# Patient Record
Sex: Male | Born: 1965 | Race: White | Hispanic: No | Smoking: Never smoker
Health system: Southern US, Community
[De-identification: ages and names within clinical notes are randomized; demographics above are authoritative.]

## PROBLEM LIST (undated history)

## (undated) DIAGNOSIS — E119 Type 2 diabetes mellitus without complications: Secondary | ICD-10-CM

## (undated) DIAGNOSIS — I10 Essential (primary) hypertension: Secondary | ICD-10-CM

## (undated) DIAGNOSIS — F419 Anxiety disorder, unspecified: Secondary | ICD-10-CM

## (undated) HISTORY — PX: CHOLECYSTECTOMY: SHX55

---

## 2005-01-06 ENCOUNTER — Emergency Department: Payer: Self-pay | Admitting: Emergency Medicine

## 2009-01-11 ENCOUNTER — Emergency Department: Payer: Self-pay | Admitting: Emergency Medicine

## 2010-03-05 ENCOUNTER — Emergency Department: Payer: Self-pay | Admitting: Internal Medicine

## 2010-04-04 ENCOUNTER — Emergency Department: Payer: Self-pay | Admitting: Emergency Medicine

## 2010-10-24 ENCOUNTER — Emergency Department: Payer: Self-pay | Admitting: Emergency Medicine

## 2015-02-28 ENCOUNTER — Ambulatory Visit: Payer: Self-pay | Admitting: Family Medicine

## 2015-05-01 ENCOUNTER — Emergency Department: Payer: Self-pay

## 2015-05-01 ENCOUNTER — Emergency Department
Admission: EM | Admit: 2015-05-01 | Discharge: 2015-05-01 | Disposition: A | Discharge status: Home / Self Care | Payer: Self-pay | Attending: Emergency Medicine | Admitting: Emergency Medicine

## 2015-05-01 ENCOUNTER — Other Ambulatory Visit: Payer: Self-pay

## 2015-05-01 DIAGNOSIS — R739 Hyperglycemia, unspecified: Secondary | ICD-10-CM

## 2015-05-01 DIAGNOSIS — F419 Anxiety disorder, unspecified: Secondary | ICD-10-CM | POA: Insufficient documentation

## 2015-05-01 DIAGNOSIS — Z87891 Personal history of nicotine dependence: Secondary | ICD-10-CM | POA: Insufficient documentation

## 2015-05-01 DIAGNOSIS — R079 Chest pain, unspecified: Secondary | ICD-10-CM | POA: Insufficient documentation

## 2015-05-01 DIAGNOSIS — I10 Essential (primary) hypertension: Secondary | ICD-10-CM | POA: Insufficient documentation

## 2015-05-01 DIAGNOSIS — E1165 Type 2 diabetes mellitus with hyperglycemia: Secondary | ICD-10-CM | POA: Insufficient documentation

## 2015-05-01 HISTORY — DX: Anxiety disorder, unspecified: F41.9

## 2015-05-01 HISTORY — DX: Essential (primary) hypertension: I10

## 2015-05-01 HISTORY — DX: Type 2 diabetes mellitus without complications: E11.9

## 2015-05-01 LAB — BASIC METABOLIC PANEL
Anion gap: 11 (ref 5–15)
BUN: 16 mg/dL (ref 6–20)
CO2: 23 mmol/L (ref 22–32)
Calcium: 8.7 mg/dL — ABNORMAL LOW (ref 8.9–10.3)
Chloride: 99 mmol/L — ABNORMAL LOW (ref 101–111)
Creatinine, Ser: 0.73 mg/dL (ref 0.61–1.24)
GFR calc Af Amer: >60 mL/min (ref >60)
GFR calc non Af Amer: >60 mL/min (ref >60)
Glucose, Bld: 300 mg/dL — ABNORMAL HIGH (ref 65–99)
Potassium: 3.7 mmol/L (ref 3.5–5.1)
Sodium: 133 mmol/L — ABNORMAL LOW (ref 135–145)

## 2015-05-01 LAB — CBC
HCT: 41.2 % (ref 40.0–52.0)
Hemoglobin: 14.4 g/dL (ref 13.0–18.0)
MCH: 29.1 pg (ref 26.0–34.0)
MCHC: 35 g/dL (ref 32.0–36.0)
MCV: 83.1 fL (ref 80.0–100.0)
Platelets: 253 10*3/uL (ref 150–440)
RBC: 4.95 MIL/uL (ref 4.40–5.90)
RDW: 13.6 % (ref 11.5–14.5)
WBC: 11.8 10*3/uL — ABNORMAL HIGH (ref 3.8–10.6)

## 2015-05-01 LAB — TROPONIN I: Troponin I: <0.03 ng/mL (ref <0.031)

## 2015-05-01 LAB — BRAIN NATRIURETIC PEPTIDE: B Natriuretic Peptide: 39 pg/mL (ref 0.0–100.0)

## 2015-05-01 LAB — GLUCOSE, CAPILLARY: Glucose-Capillary: 272 mg/dL — ABNORMAL HIGH (ref 65–99)

## 2015-05-01 MED ORDER — DIAZEPAM 5 MG PO TABS: 5.0000 mg | ORAL_TABLET | Freq: Three times a day (TID) | ORAL | Status: AC | PRN

## 2015-05-01 MED ORDER — ESCITALOPRAM OXALATE 10 MG PO TABS: 10.0000 mg | ORAL_TABLET | Freq: Every day | ORAL | Status: DC

## 2015-05-01 MED ORDER — DIAZEPAM 5 MG PO TABS
10.0000 mg | ORAL_TABLET | Freq: Once | ORAL | Status: AC
  Administered 2015-05-01: 10 mg via ORAL
  Filled 2015-05-01: qty 2

## 2015-05-01 NOTE — ED Provider Notes (Signed)
[facility] Emergency Department Provider Note     Time seen: ----------------------------------------- 8:16 AM on 05/01/2015 -----------------------------------------    I have reviewed the triage vital signs and the nursing notes.   HISTORY  Chief Complaint Chest Pain    HPI Walter [name] is a 49 y.o. male who presents ER with chest tightness since 3 AM. Patient states he woke up very anxious, short of breath, both arms went numb. Patient describes nausea and diaphoresis which is common for him with panic attacks. Patient states she has history anxiety and is advised to take citalopram as needed. Patient states he called his doctor who advised him to double up on his medications. Patient also notes his blood sugars been elevated.   Past Medical History  Diagnosis Date  . Anxiety   . Diabetes mellitus without complication   . Hypertension     There are no active problems to display for this patient.   Past Surgical History  Procedure Laterality Date  . Cholecystectomy      Allergies Review of patient's allergies indicates no known allergies.  Social History History  Substance Use Topics  . Smoking status: Former Games developer  . Smokeless tobacco: Never Used  . Alcohol Use: No    Review of Systems Constitutional: Negative for fever. Eyes: Negative for visual changes. ENT: Negative for sore throat. Cardiovascular: Positive for chest tightness Respiratory: Positive shortness of breath Gastrointestinal: Negative for abdominal pain, vomiting and diarrhea. Genitourinary: Negative for dysuria. Musculoskeletal: Negative for back pain. Skin: Positive for sweats Neurological: Negative for headaches, focal weakness or numbness. Psychiatric: Positive for anxiety 10-point ROS otherwise negative.  ____________________________________________   PHYSICAL EXAM:  VITAL SIGNS: ED Triage Vitals  Enc Vitals Group     BP 05/01/15 0738 150/90 mmHg      Pulse Rate 05/01/15 0738 79     Resp 05/01/15 0738 20     Temp 05/01/15 0738 98 F (36.7 C)     Temp Source 05/01/15 0738 Oral     SpO2 05/01/15 0738 96 %     Weight 05/01/15 0738 220 lb (99.791 kg)     Height 05/01/15 0738  (1.727 m)     Head Cir --      Peak Flow --      Pain Score 05/01/15 0739 6     Pain Loc --      Pain Edu? --      Excl. in [place]? --     Constitutional: Alert and oriented. Mild anxiety Eyes: Conjunctivae are normal. PERRL. Normal extraocular movements. ENT   Head: Normocephalic and atraumatic.   Nose: No congestion/rhinnorhea.   Mouth/Throat: Mucous membranes are moist.   Neck: No stridor. Cardiovascular: Normal rate, regular rhythm. Normal and symmetric distal pulses are present in all extremities. No murmurs, rubs, or gallops. Respiratory: Normal respiratory effort without tachypnea nor retractions. Breath sounds are clear and equal bilaterally. No wheezes/rales/rhonchi. Gastrointestinal: Soft and nontender. No distention. No abdominal bruits.  Musculoskeletal: Nontender with normal range of motion in all extremities. No joint effusions.  No lower extremity tenderness nor edema. Neurologic:  Normal speech and language. No gross focal neurologic deficits are appreciated. Speech is normal. No gait instability. Skin:  Skin is warm, dry and intact. No rash noted. Psychiatric: Mood and affect are normal. Speech and behavior are normal. Patient exhibits appropriate insight and judgment. ____________________________________________  EKG: Interpreted by me. Normal sinus rhythm with a rate of 78 bpm, normal axis normal intervals. No  evidence of hypertrophy or acute infarction.  ____________________________________________  ED COURSE:  Pertinent labs & imaging results that were available during my care of the patient were reviewed by me and considered in my medical decision making (see chart for details). Patient will need cardiac labs, we'll  give oral Valium and check his blood sugar. ____________________________________________    LABS (pertinent positives/negatives)  Labs Reviewed  BASIC METABOLIC PANEL - Abnormal; Notable for the following:    Sodium 133 (*)    Chloride 99 (*)    Glucose, Bld 300 (*)    Calcium 8.7 (*)    All other components within normal limits  CBC - Abnormal; Notable for the following:    WBC 11.8 (*)    All other components within normal limits  GLUCOSE, CAPILLARY - Abnormal; Notable for the following:    Glucose-Capillary 272 (*)    All other components within normal limits  TROPONIN I  BRAIN NATRIURETIC PEPTIDE  CBG MONITORING, ED    RADIOLOGY Images were viewed by me  Chest x-ray is unremarkable  ____________________________________________  FINAL ASSESSMENT AND PLAN  Chest pain, panic attack, hyperglycemia  Plan: Patient with labs and imaging as dictated above. Patient be given Valium to take as needed for panic attacks. After the Valium he received here, he felt much better. He is advised to start on Celexa daily. He states ordinarily his blood sugar has been under control, it is likely elevated state due to stress.   Emily Filbert, MD   Emily Filbert, MD 05/01/15 [phone]

## 2015-05-01 NOTE — ED Notes (Signed)
Pt c/o generalized chest tightness since 3am with SOB, diaphoresis, nausea..states he has a hx of anxiety but this feels different.

## 2015-05-01 NOTE — Discharge Instructions (Signed)
Chest Pain (Nonspecific) It is often hard to give a diagnosis for the cause of chest pain. There is always a chance that your pain could be related to something serious, such as a heart attack or a blood clot in the lungs. You need to follow up with your doctor. HOME CARE  If antibiotic medicine was given, take it as directed by your doctor. Finish the medicine even if you start to feel better.  For the next few days, avoid activities that bring on chest pain. Continue physical activities as told by your doctor.  Do not use any tobacco products. This includes cigarettes, chewing tobacco, and e-cigarettes.  Avoid drinking alcohol.  Only take medicine as told by your doctor.  Follow your doctor's suggestions for more testing if your chest pain does not go away.  Keep all doctor visits you made. GET HELP IF:  Your chest pain does not go away, even after treatment.  You have a rash with blisters on your chest.  You have a fever. GET HELP RIGHT AWAY IF:   You have more pain or pain that spreads to your arm, neck, jaw, back, or belly (abdomen).  You have shortness of breath.  You cough more than usual or cough up blood.  You have very bad back or belly pain.  You feel sick to your stomach (nauseous) or throw up (vomit).  You have very bad weakness.  You pass out (faint).  You have chills. This is an emergency. Do not wait to see if the problems will go away. Call your local emergency services (911 in [place] Do not drive yourself to the hospital. MAKE SURE YOU:   Understand these instructions.  Will watch your condition.  Will get help right away if you are not doing well or get worse. Document Released: 02/27/2008 Document Revised: 09/15/2013 Document Reviewed: 02/27/2008 [facility] Patient Information 2015 [place], [place]. This information is not intended to replace advice given to you by your health care provider. Make sure you discuss any questions you have with your  health care provider.  Panic Attacks Panic attacks are sudden, short-livedsurges of severe anxiety, fear, or discomfort. They may occur for no reason when you are relaxed, when you are anxious, or when you are sleeping. Panic attacks may occur for a number of reasons:   Healthy people occasionally have panic attacks in extreme, life-threatening situations, such as war or natural disasters. Normal anxiety is a protective mechanism of the body that helps [place] react to danger (fight or flight response).  Panic attacks are often seen with anxiety disorders, such as panic disorder, social anxiety disorder, generalized anxiety disorder, and phobias. Anxiety disorders cause excessive or uncontrollable anxiety. They may interfere with your relationships or other life activities.  Panic attacks are sometimes seen with other mental illnesses, such as depression and posttraumatic stress disorder.  Certain medical conditions, prescription medicines, and drugs of abuse can cause panic attacks. SYMPTOMS  Panic attacks start suddenly, peak within 20 minutes, and are accompanied by four or more of the following symptoms:  Pounding heart or fast heart rate (palpitations).  Sweating.  Trembling or shaking.  Shortness of breath or feeling smothered.  Feeling choked.  Chest pain or discomfort.  Nausea or strange feeling in your stomach.  Dizziness, light-headedness, or feeling like you will faint.  Chills or hot flushes.  Numbness or tingling in your lips or hands and feet.  Feeling that things are not real or feeling that you are not yourself.  Fear of losing control or going crazy.  Fear of dying. Some of these symptoms can mimic serious medical conditions. For example, you may think you are having a heart attack. Although panic attacks can be very scary, they are not life threatening. DIAGNOSIS  Panic attacks are diagnosed through an assessment by your health care provider. Your health care  provider will ask questions about your symptoms, such as where and when they occurred. Your health care provider will also ask about your medical history and use of alcohol and drugs, including prescription medicines. Your health care provider may order blood tests or other studies to rule out a serious medical condition. Your health care provider may refer you to a mental health professional for further evaluation. TREATMENT   Most healthy people who have one or two panic attacks in an extreme, life-threatening situation will not require treatment.  The treatment for panic attacks associated with anxiety disorders or other mental illness typically involves counseling with a mental health professional, medicine, or a combination of both. Your health care provider will help determine what treatment is best for you.  Panic attacks due to physical illness usually go away with treatment of the illness. If prescription medicine is causing panic attacks, talk with your health care provider about stopping the medicine, decreasing the dose, or substituting another medicine.  Panic attacks due to alcohol or drug abuse go away with abstinence. Some adults need professional help in order to stop drinking or using drugs. HOME CARE INSTRUCTIONS   Take all medicines as directed by your health care provider.   Schedule and attend follow-up visits as directed by your health care provider. It is important to keep all your appointments. SEEK MEDICAL CARE IF:  You are not able to take your medicines as prescribed.  Your symptoms do not improve or get worse. SEEK IMMEDIATE MEDICAL CARE IF:   You experience panic attack symptoms that are different than your usual symptoms.  You have serious thoughts about hurting yourself or others.  You are taking medicine for panic attacks and have a serious side effect. MAKE SURE YOU:  Understand these instructions.  Will watch your condition.  Will get help right  away if you are not doing well or get worse. Document Released: 09/10/2005 Document Revised: 09/15/2013 Document Reviewed: 04/24/2013 [facility] Patient Information 2015 [place], [place]. This information is not intended to replace advice given to you by your health care provider. Make sure you discuss any questions you have with your health care provider.  High Blood Sugar High blood sugar (hyperglycemia) means that the level of sugar in your blood is higher than it should be. Signs of high blood sugar include:  Feeling thirsty.  Frequent peeing (urinating).  Feeling tired or sleepy.  Dry mouth.  Vision changes.  Feeling weak.  Feeling hungry but losing weight.  Numbness and tingling in your hands or feet.  Headache. When you ignore these signs, your blood sugar may keep going up. These problems may get worse, and other problems may begin. HOME CARE  Check your blood sugars as told by your doctor. Write down the numbers with the date and time.  Take the right amount of insulin or diabetes pills at the right time. Write down the dose with date and time.  Refill your insulin or diabetes pills before running out.  Watch what you eat. Follow your meal plan.  Drink liquids without sugar, such as water. Check with your doctor if you have kidney or  heart disease.  Follow your doctor's orders for exercise. Exercise at the same time of day.  Keep your doctor's appointments. GET HELP RIGHT AWAY IF:   You have trouble thinking or are confused.  You have fast breathing with fruity smelling breath.  You pass out (faint).  You have 2 to 3 days of high blood sugars and you do not know why.  You have chest pain.  You are feeling sick to your stomach (nauseous) or throwing up (vomiting).  You have sudden vision changes. MAKE SURE YOU:   Understand these instructions.  Will watch your condition.  Will get help right away if you are not doing well or get worse. Document  Released: 07/08/2009 Document Revised: 12/03/2011 Document Reviewed: 07/08/2009 [facility] Patient Information 2015 [place], [place]. This information is not intended to replace advice given to you by your health care provider. Make sure you discuss any questions you have with your health care provider.

## 2019-06-25 ENCOUNTER — Other Ambulatory Visit: Payer: Self-pay

## 2019-06-25 ENCOUNTER — Emergency Department
Admission: EM | Admit: 2019-06-25 | Discharge: 2019-06-25 | Disposition: A | Discharge status: Home / Self Care | Payer: Self-pay | Attending: Emergency Medicine | Admitting: Emergency Medicine

## 2019-06-25 ENCOUNTER — Emergency Department: Payer: Self-pay

## 2019-06-25 DIAGNOSIS — Y929 Unspecified place or not applicable: Secondary | ICD-10-CM | POA: Insufficient documentation

## 2019-06-25 DIAGNOSIS — Z794 Long term (current) use of insulin: Secondary | ICD-10-CM | POA: Insufficient documentation

## 2019-06-25 DIAGNOSIS — E119 Type 2 diabetes mellitus without complications: Secondary | ICD-10-CM | POA: Insufficient documentation

## 2019-06-25 DIAGNOSIS — I10 Essential (primary) hypertension: Secondary | ICD-10-CM | POA: Insufficient documentation

## 2019-06-25 DIAGNOSIS — Y939 Activity, unspecified: Secondary | ICD-10-CM | POA: Insufficient documentation

## 2019-06-25 DIAGNOSIS — S022XXB Fracture of nasal bones, initial encounter for open fracture: Secondary | ICD-10-CM | POA: Insufficient documentation

## 2019-06-25 DIAGNOSIS — W51XXXA Accidental striking against or bumped into by another person, initial encounter: Secondary | ICD-10-CM | POA: Insufficient documentation

## 2019-06-25 DIAGNOSIS — Z87891 Personal history of nicotine dependence: Secondary | ICD-10-CM | POA: Insufficient documentation

## 2019-06-25 DIAGNOSIS — Y999 Unspecified external cause status: Secondary | ICD-10-CM | POA: Insufficient documentation

## 2019-06-25 DIAGNOSIS — Z79899 Other long term (current) drug therapy: Secondary | ICD-10-CM | POA: Insufficient documentation

## 2019-06-25 MED ORDER — LIDOCAINE HCL 1 % IJ SOLN
5.0000 mL | Freq: Once | INTRAMUSCULAR | Status: DC
  Filled 2019-06-25: qty 10

## 2019-06-25 MED ORDER — SULFAMETHOXAZOLE-TRIMETHOPRIM 800-160 MG PO TABS: 1.0000 | ORAL_TABLET | Freq: Two times a day (BID) | ORAL | 0 refills | Status: AC

## 2019-06-25 NOTE — Discharge Instructions (Signed)
You have been diagnosed with nasal bone fractures. Sutures given in emergency department will dissolve and you will not need them removed. Please take Bactrim twice daily for the next 7 days.
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
## 2020-05-04 ENCOUNTER — Encounter: Payer: Self-pay | Admitting: Emergency Medicine

## 2020-05-04 ENCOUNTER — Emergency Department: Payer: HRSA Program

## 2020-05-04 ENCOUNTER — Inpatient Hospital Stay
Admission: EM | Admit: 2020-05-04 | Discharge: 2020-05-05 | DRG: 177 | Discharge status: Against Medical Advice | Payer: HRSA Program | Attending: Internal Medicine | Admitting: Internal Medicine

## 2020-05-04 DIAGNOSIS — J9601 Acute respiratory failure with hypoxia: Secondary | ICD-10-CM | POA: Diagnosis present

## 2020-05-04 DIAGNOSIS — E861 Hypovolemia: Secondary | ICD-10-CM | POA: Diagnosis present

## 2020-05-04 DIAGNOSIS — J1282 Pneumonia due to coronavirus disease 2019: Secondary | ICD-10-CM | POA: Diagnosis present

## 2020-05-04 DIAGNOSIS — F419 Anxiety disorder, unspecified: Secondary | ICD-10-CM | POA: Diagnosis present

## 2020-05-04 DIAGNOSIS — Z79899 Other long term (current) drug therapy: Secondary | ICD-10-CM

## 2020-05-04 DIAGNOSIS — U071 COVID-19: Principal | ICD-10-CM | POA: Diagnosis present

## 2020-05-04 DIAGNOSIS — E871 Hypo-osmolality and hyponatremia: Secondary | ICD-10-CM | POA: Diagnosis present

## 2020-05-04 DIAGNOSIS — F329 Major depressive disorder, single episode, unspecified: Secondary | ICD-10-CM | POA: Diagnosis present

## 2020-05-04 DIAGNOSIS — E1165 Type 2 diabetes mellitus with hyperglycemia: Secondary | ICD-10-CM | POA: Diagnosis present

## 2020-05-04 DIAGNOSIS — R0602 Shortness of breath: Secondary | ICD-10-CM

## 2020-05-04 DIAGNOSIS — Z794 Long term (current) use of insulin: Secondary | ICD-10-CM

## 2020-05-04 DIAGNOSIS — E876 Hypokalemia: Secondary | ICD-10-CM | POA: Diagnosis present

## 2020-05-04 DIAGNOSIS — Z9049 Acquired absence of other specified parts of digestive tract: Secondary | ICD-10-CM

## 2020-05-04 DIAGNOSIS — Z87891 Personal history of nicotine dependence: Secondary | ICD-10-CM

## 2020-05-04 DIAGNOSIS — I10 Essential (primary) hypertension: Secondary | ICD-10-CM | POA: Diagnosis present

## 2020-05-04 LAB — CBC
HCT: 41 % (ref 39.0–52.0)
Hemoglobin: 13.7 g/dL (ref 13.0–17.0)
MCH: 28 pg (ref 26.0–34.0)
MCHC: 33.4 g/dL (ref 30.0–36.0)
MCV: 83.8 fL (ref 80.0–100.0)
Platelets: 154 10*3/uL (ref 150–400)
RBC: 4.89 MIL/uL (ref 4.22–5.81)
RDW: 13.4 % (ref 11.5–15.5)
WBC: 7.9 10*3/uL (ref 4.0–10.5)
nRBC: 0 % (ref 0.0–0.2)

## 2020-05-04 MED ORDER — SODIUM CHLORIDE 0.9 % IV SOLN
100.0000 mg | Freq: Every day | INTRAVENOUS | Status: DC
  Administered 2020-05-05: 100 mg via INTRAVENOUS
  Filled 2020-05-04: qty 20
  Filled 2020-05-04: qty 100

## 2020-05-04 MED ORDER — SODIUM CHLORIDE 0.9 % IV SOLN
500.0000 mg | Freq: Once | INTRAVENOUS | Status: AC
  Administered 2020-05-04: 500 mg via INTRAVENOUS
  Filled 2020-05-04: qty 500

## 2020-05-04 MED ORDER — SODIUM CHLORIDE 0.9 % IV SOLN
200.0000 mg | Freq: Once | INTRAVENOUS | Status: AC
  Administered 2020-05-05: 200 mg via INTRAVENOUS
  Filled 2020-05-04: qty 200

## 2020-05-04 MED ORDER — SODIUM CHLORIDE 0.9 % IV SOLN
1.0000 g | Freq: Once | INTRAVENOUS | Status: AC
  Administered 2020-05-04: 1 g via INTRAVENOUS
  Filled 2020-05-04: qty 10

## 2020-05-04 MED ORDER — DEXAMETHASONE SODIUM PHOSPHATE 10 MG/ML IJ SOLN
10.0000 mg | Freq: Once | INTRAMUSCULAR | Status: AC
  Administered 2020-05-04: 10 mg via INTRAVENOUS
  Filled 2020-05-04: qty 1

## 2020-05-04 NOTE — Progress Notes (Signed)
Remdesivir - Pharmacy Brief Note   O:  CXR: IMPRESSION: Patchy bilateral airspace opacities and consolidations concerning for bilateral pneumonia. There is cardiomegaly with mild central vascular congestion. SpO2: 89 - 91% on 4L [place]   A/P:  Remdesivir 200 mg IVPB once followed by 100 mg IVPB daily x 4 days.   Thomasene Ripple, PharmD, BCPS Clinical Pharmacist 05/04/2020 11:21 PM

## 2020-05-04 NOTE — ED Provider Notes (Signed)
[facility] Emergency Department Provider Note  ____________________________________________   First MD Initiated Contact with Patient 05/04/20 2319     (approximate)  I have reviewed the triage vital signs and the nursing notes.   HISTORY  Chief Complaint Shortness of Breath   HPI Walter [name] is a 54 y.o. male with below list of previous medical conditions including hypertension and diabetes, recently tested positive for COVID-19 on 05/01/2020 presents to the emergency department secondary to progressive dyspnea over the course of today.  Patient admits to fever generalized weakness and worsening difficulty breathing.  Patient states that he started feeling ill approximately 10 days ago.  Patient does admit that he did not receive the COVID-19 vaccine.        Past Medical History:  Diagnosis Date  . Anxiety   . Diabetes mellitus without complication (HCC)   . Hypertension     There are no problems to display for this patient.   Past Surgical History:  Procedure Laterality Date  . CHOLECYSTECTOMY      Prior to Admission medications   Medication Sig Start Date End Date Taking? Authorizing Provider  amLODipine (NORVASC) 5 MG tablet Take 5 mg by mouth daily.    [provider]  citalopram (CELEXA) 20 MG tablet Take 20 mg by mouth daily as needed (for anxiety.).    [provider]  escitalopram (LEXAPRO) 10 MG tablet Take 1 tablet (10 mg total) by mouth daily. 05/01/15 04/30/16  Emily Filbert, MD  insulin NPH-regular Human (NOVOLIN 70/30) (70-30) 100 UNIT/ML injection Inject 70 Units into the skin 3 (three) times daily.    [provider]  losartan-hydrochlorothiazide (HYZAAR) 100-12.5 MG per tablet Take 1 tablet by mouth daily.    [provider]  metoprolol succinate (TOPROL-XL) 50 MG 24 hr tablet Take 50 mg by mouth daily. Take with or immediately following a meal.    [provider]     Allergies Patient has no known allergies.  History reviewed. No pertinent family history.  Social History Social History   Tobacco Use  . Smoking status: Former Games developer  . Smokeless tobacco: Never Used  Substance Use Topics  . Alcohol use: No  . Drug use: No    Review of Systems Constitutional: No fever/chills Eyes: No visual changes. ENT: No sore throat. Cardiovascular: Denies chest pain. Respiratory: Positive for cough and dyspnea Gastrointestinal: No abdominal pain.  No nausea, no vomiting.  No diarrhea.  No constipation. Genitourinary: Negative for dysuria. Musculoskeletal: Negative for neck pain.  Negative for back pain. Integumentary: Negative for rash. Neurological: Negative for headaches, focal weakness or numbness.   ____________________________________________   PHYSICAL EXAM:  VITAL SIGNS: ED Triage Vitals [05/04/20 2248]  Enc Vitals Group     BP (!) 132/53     Pulse Rate 82     Resp (!) 24     Temp 99.3 F (37.4 C)     Temp Source Oral     SpO2 (!) 89 %     Weight      Height      Head Circumference      Peak Flow      Pain Score      Pain Loc      Pain Edu?      Excl. in GC?     Constitutional: Alert and oriented.  Apparent respiratory difficulty Eyes: Conjunctivae are normal.  Mouth/Throat: Patient is wearing a mask. Neck: No stridor.  No meningeal  signs.   Cardiovascular: Normal rate, regular rhythm. Good peripheral circulation. Grossly normal heart sounds. Respiratory: Tachypnea, positive accessory respiratory muscle use, diffuse rhonchi on auscultation. Gastrointestinal: Soft and nontender. No distention.  Musculoskeletal: No lower extremity tenderness nor edema. No gross deformities of extremities. Neurologic:  Normal speech and language. No gross focal neurologic deficits are appreciated.  Skin:  Skin is warm, dry and intact. Psychiatric: Mood and affect are normal. Speech and behavior are  normal.  ____________________________________________   LABS (all labs ordered are listed, but only abnormal results are displayed)  Labs Reviewed  BASIC METABOLIC PANEL - Abnormal; Notable for the following components:      Result Value   Sodium 128 (*)    Potassium 3.3 (*)    Chloride 91 (*)    Glucose, Bld 258 (*)    BUN 21 (*)    Creatinine, Ser 1.30 (*)    Calcium 7.8 (*)    All other components within normal limits  FIBRIN DERIVATIVES D-DIMER ([facility] ONLY) - Abnormal; Notable for the following components:   Fibrin derivatives D-dimer ([facility]) 825.68 (*)    All other components within normal limits  LACTATE DEHYDROGENASE - Abnormal; Notable for the following components:   LDH 254 (*)    All other components within normal limits  FERRITIN - Abnormal; Notable for the following components:   Ferritin 547 (*)    All other components within normal limits  FIBRINOGEN - Abnormal; Notable for the following components:   Fibrinogen 737 (*)    All other components within normal limits  CULTURE, BLOOD (ROUTINE X 2)  CULTURE, BLOOD (ROUTINE X 2)  CBC  PROTIME-INR  BRAIN NATRIURETIC PEPTIDE  LACTIC ACID, PLASMA  PROCALCITONIN  TRIGLYCERIDES  LACTIC ACID, PLASMA  C-REACTIVE PROTEIN  TROPONIN I (HIGH SENSITIVITY)  TROPONIN I (HIGH SENSITIVITY)    ____________________________________________  RADIOLOGY I, Hurlock N Roshana Shuffield, personally viewed and evaluated these images (plain radiographs) as part of my medical decision making, as well as reviewing the written report by the radiologist.  ED MD interpretation: Patchy bilateral airspace opacities and consolidation concerning for bilateral pneumonia radiologist.  Official radiology report(s): DG Chest 1 View  Result Date: 05/04/2020 CLINICAL DATA:  Shortness of breath history of COVID EXAM: CHEST  1 VIEW COMPARISON:  05/01/2015 FINDINGS: Patchy bilateral vague airspace opacities and consolidations. Enlarged cardiomediastinal  silhouette with mild central vascular congestion. No pleural effusion or pneumothorax. IMPRESSION: Patchy bilateral airspace opacities and consolidations concerning for bilateral pneumonia. There is cardiomegaly with mild central vascular congestion. Electronically Signed   By: Jasmine Pang M.D.   On: 05/04/2020 23:11    ____________________________________________   PROCEDURES   .Critical Care Performed by: Darci Current, MD Authorized by: Darci Current, MD   Critical care provider statement:    Critical care time (minutes):  30   Critical care time was exclusive of:  Separately billable procedures and treating other patients   Critical care was necessary to treat or prevent imminent or life-threatening deterioration of the following conditions:  Respiratory failure   Critical care was time spent personally by me on the following activities:  Development of treatment plan with patient or surrogate, discussions with consultants, evaluation of patient's response to treatment, examination of patient, obtaining history from patient or surrogate, ordering and performing treatments and interventions, ordering and review of laboratory studies, ordering and review of radiographic studies, pulse oximetry, re-evaluation of patient's condition and review of old charts     ____________________________________________   INITIAL  IMPRESSION / MDM / ASSESSMENT AND PLAN / ED COURSE  As part of my medical decision making, I reviewed the following data within the electronic MEDICAL RECORD NUMBER  54 year old male presented with above-stated history and physical exam a differential diagnosis including but not limited to COVID-19 pneumonia.  Chest x-ray finding consistent with multifocal pneumonia.  Patient was placed on 6 L of oxygen via nasal cannula however oxygen saturation remained poor at 88 and as such high flow oxygen was applied.  Patient given Decadron 10 mg IV.  Remdesivir ordered.  Patient  discussed with hospitalist after hospital admission further evaluation and management.      ____________________________________________  FINAL CLINICAL IMPRESSION(S) / ED DIAGNOSES  Final diagnoses:  Pneumonia due to COVID-19 virus     MEDICATIONS GIVEN DURING THIS VISIT:  Medications  remdesivir 200 mg in sodium chloride 0.9% 250 mL IVPB (200 mg Intravenous New Bag/Given 05/05/20 0036)    Followed by  remdesivir 100 mg in sodium chloride 0.9 % 100 mL IVPB (has no administration in time range)  dexamethasone (DECADRON) injection 10 mg (10 mg Intravenous Given 05/04/20 2336)  cefTRIAXone (ROCEPHIN) 1 g in sodium chloride 0.9 % 100 mL IVPB (0 g Intravenous Stopped 05/05/20 0032)  azithromycin (ZITHROMAX) 500 mg in sodium chloride 0.9 % 250 mL IVPB (0 mg Intravenous Stopped 05/05/20 0042)  sodium chloride 0.9 % bolus 1,000 mL (1,000 mLs Intravenous New Bag/Given 05/05/20 0038)     ED Discharge Orders    None      *Please note:  TREVEN HOLTMAN was evaluated in Emergency Department on 05/05/2020 for the symptoms described in the history of present illness. He was evaluated in the context of the global COVID-19 pandemic, which necessitated consideration that the patient might be at risk for infection with the SARS-CoV-2 virus that causes COVID-19. Institutional protocols and algorithms that pertain to the evaluation of patients at risk for COVID-19 are in a state of rapid change based on information released by regulatory bodies including the CDC and federal and state organizations. These policies and algorithms were followed during the patient's care in the ED.  Some ED evaluations and interventions may be delayed as a result of limited staffing during and after the pandemic.*  Note:  This document was prepared using Dragon voice recognition software and may include unintentional dictation errors.   Darci Current, MD 05/05/20 [phone]

## 2020-05-04 NOTE — ED Triage Notes (Signed)
Pt arrived via EMS from home where he called out for SOB on exertion. Per EMS pts O2 in low 70s while walking, placed on 3L with rise to 93%. Pt diagnosed with COVID on 8/8.

## 2020-05-05 ENCOUNTER — Other Ambulatory Visit: Payer: Self-pay

## 2020-05-05 DIAGNOSIS — E1165 Type 2 diabetes mellitus with hyperglycemia: Secondary | ICD-10-CM | POA: Diagnosis present

## 2020-05-05 DIAGNOSIS — E861 Hypovolemia: Secondary | ICD-10-CM | POA: Diagnosis present

## 2020-05-05 DIAGNOSIS — Z79899 Other long term (current) drug therapy: Secondary | ICD-10-CM | POA: Diagnosis not present

## 2020-05-05 DIAGNOSIS — E119 Type 2 diabetes mellitus without complications: Secondary | ICD-10-CM | POA: Diagnosis not present

## 2020-05-05 DIAGNOSIS — Z794 Long term (current) use of insulin: Secondary | ICD-10-CM

## 2020-05-05 DIAGNOSIS — U071 COVID-19: Secondary | ICD-10-CM | POA: Diagnosis not present

## 2020-05-05 DIAGNOSIS — I10 Essential (primary) hypertension: Secondary | ICD-10-CM | POA: Diagnosis not present

## 2020-05-05 DIAGNOSIS — E876 Hypokalemia: Secondary | ICD-10-CM

## 2020-05-05 DIAGNOSIS — J9601 Acute respiratory failure with hypoxia: Secondary | ICD-10-CM

## 2020-05-05 DIAGNOSIS — Z9049 Acquired absence of other specified parts of digestive tract: Secondary | ICD-10-CM | POA: Diagnosis not present

## 2020-05-05 DIAGNOSIS — F419 Anxiety disorder, unspecified: Secondary | ICD-10-CM | POA: Diagnosis present

## 2020-05-05 DIAGNOSIS — E871 Hypo-osmolality and hyponatremia: Secondary | ICD-10-CM | POA: Diagnosis present

## 2020-05-05 DIAGNOSIS — F329 Major depressive disorder, single episode, unspecified: Secondary | ICD-10-CM | POA: Diagnosis present

## 2020-05-05 DIAGNOSIS — J1282 Pneumonia due to coronavirus disease 2019: Secondary | ICD-10-CM

## 2020-05-05 DIAGNOSIS — R0602 Shortness of breath: Secondary | ICD-10-CM | POA: Diagnosis present

## 2020-05-05 DIAGNOSIS — Z87891 Personal history of nicotine dependence: Secondary | ICD-10-CM | POA: Diagnosis not present

## 2020-05-05 LAB — GLUCOSE, CAPILLARY
Glucose-Capillary: 393 mg/dL — ABNORMAL HIGH (ref 70–99)
Glucose-Capillary: 437 mg/dL — ABNORMAL HIGH (ref 70–99)
Glucose-Capillary: 472 mg/dL — ABNORMAL HIGH (ref 70–99)
Glucose-Capillary: 370 mg/dL — ABNORMAL HIGH (ref 70–99)

## 2020-05-05 LAB — COMPREHENSIVE METABOLIC PANEL
ALT: 29 U/L (ref 0–44)
AST: 44 U/L — ABNORMAL HIGH (ref 15–41)
Albumin: 3 g/dL — ABNORMAL LOW (ref 3.5–5.0)
Alkaline Phosphatase: 53 U/L (ref 38–126)
Anion gap: 13 (ref 5–15)
BUN: 25 mg/dL — ABNORMAL HIGH (ref 6–20)
CO2: 23 mmol/L (ref 22–32)
Calcium: 7.9 mg/dL — ABNORMAL LOW (ref 8.9–10.3)
Chloride: 97 mmol/L — ABNORMAL LOW (ref 98–111)
Creatinine, Ser: 1.16 mg/dL (ref 0.61–1.24)
GFR calc Af Amer: >60 mL/min (ref >60)
GFR calc non Af Amer: >60 mL/min (ref >60)
Glucose, Bld: 450 mg/dL — ABNORMAL HIGH (ref 70–99)
Potassium: 3.8 mmol/L (ref 3.5–5.1)
Sodium: 133 mmol/L — ABNORMAL LOW (ref 135–145)
Total Bilirubin: 1.1 mg/dL (ref 0.3–1.2)
Total Protein: 7.1 g/dL (ref 6.5–8.1)

## 2020-05-05 LAB — HEMOGLOBIN A1C
Hgb A1c MFr Bld: 9.9 % — ABNORMAL HIGH (ref 4.8–5.6)
Mean Plasma Glucose: 237.43 mg/dL

## 2020-05-05 LAB — BASIC METABOLIC PANEL
Anion gap: 14 (ref 5–15)
BUN: 21 mg/dL — ABNORMAL HIGH (ref 6–20)
CO2: 23 mmol/L (ref 22–32)
Calcium: 7.8 mg/dL — ABNORMAL LOW (ref 8.9–10.3)
Chloride: 91 mmol/L — ABNORMAL LOW (ref 98–111)
Creatinine, Ser: 1.3 mg/dL — ABNORMAL HIGH (ref 0.61–1.24)
GFR calc Af Amer: >60 mL/min (ref >60)
GFR calc non Af Amer: >60 mL/min (ref >60)
Glucose, Bld: 258 mg/dL — ABNORMAL HIGH (ref 70–99)
Potassium: 3.3 mmol/L — ABNORMAL LOW (ref 3.5–5.1)
Sodium: 128 mmol/L — ABNORMAL LOW (ref 135–145)

## 2020-05-05 LAB — BRAIN NATRIURETIC PEPTIDE: B Natriuretic Peptide: 30.1 pg/mL (ref 0.0–100.0)

## 2020-05-05 LAB — TROPONIN I (HIGH SENSITIVITY)
Troponin I (High Sensitivity): 11 ng/L (ref <18)
Troponin I (High Sensitivity): 11 ng/L (ref <18)

## 2020-05-05 LAB — FIBRIN DERIVATIVES D-DIMER (ARMC ONLY)
Fibrin derivatives D-dimer (ARMC): 825.68 ng/mL (FEU) — ABNORMAL HIGH (ref 0.00–499.00)
Fibrin derivatives D-dimer (ARMC): 774.04 ng/mL (FEU) — ABNORMAL HIGH (ref 0.00–499.00)

## 2020-05-05 LAB — LACTATE DEHYDROGENASE
LDH: 254 U/L — ABNORMAL HIGH (ref 98–192)
LDH: 291 U/L — ABNORMAL HIGH (ref 98–192)

## 2020-05-05 LAB — FIBRINOGEN: Fibrinogen: 737 mg/dL — ABNORMAL HIGH (ref 210–475)

## 2020-05-05 LAB — HIV ANTIBODY (ROUTINE TESTING W REFLEX): HIV Screen 4th Generation wRfx: NONREACTIVE

## 2020-05-05 LAB — ABO/RH: ABO/RH(D): A POS

## 2020-05-05 LAB — PROTIME-INR
INR: 1 (ref 0.8–1.2)
Prothrombin Time: 13.2 seconds (ref 11.4–15.2)

## 2020-05-05 LAB — PROCALCITONIN
Procalcitonin: 0.23 ng/mL
Procalcitonin: 0.23 ng/mL

## 2020-05-05 LAB — MAGNESIUM: Magnesium: 2.1 mg/dL (ref 1.7–2.4)

## 2020-05-05 LAB — C-REACTIVE PROTEIN
CRP: 7.1 mg/dL — ABNORMAL HIGH (ref <1.0)
CRP: 7.3 mg/dL — ABNORMAL HIGH (ref <1.0)

## 2020-05-05 LAB — FERRITIN
Ferritin: 547 ng/mL — ABNORMAL HIGH (ref 24–336)
Ferritin: 580 ng/mL — ABNORMAL HIGH (ref 24–336)

## 2020-05-05 LAB — LACTIC ACID, PLASMA: Lactic Acid, Venous: 1.6 mmol/L (ref 0.5–1.9)

## 2020-05-05 LAB — TRIGLYCERIDES: Triglycerides: 79 mg/dL (ref <150)

## 2020-05-05 MED ORDER — DOCUSATE SODIUM 100 MG PO CAPS
100.0000 mg | ORAL_CAPSULE | Freq: Two times a day (BID) | ORAL | Status: DC
  Administered 2020-05-05 (×2): 100 mg via ORAL
  Filled 2020-05-05: qty 1

## 2020-05-05 MED ORDER — LOSARTAN POTASSIUM-HCTZ 100-12.5 MG PO TABS: 1.0000 | ORAL_TABLET | Freq: Every day | ORAL | Status: DC

## 2020-05-05 MED ORDER — SODIUM CHLORIDE 0.9 % IV SOLN: 200.0000 mg | Freq: Once | INTRAVENOUS | Status: DC

## 2020-05-05 MED ORDER — LOSARTAN POTASSIUM 50 MG PO TABS
100.0000 mg | ORAL_TABLET | Freq: Every day | ORAL | Status: DC
  Administered 2020-05-05: 100 mg via ORAL
  Filled 2020-05-05: qty 2

## 2020-05-05 MED ORDER — CITALOPRAM HYDROBROMIDE 20 MG PO TABS: 20.0000 mg | ORAL_TABLET | Freq: Every day | ORAL | Status: DC | PRN

## 2020-05-05 MED ORDER — METOPROLOL SUCCINATE ER 50 MG PO TB24
50.0000 mg | ORAL_TABLET | Freq: Every day | ORAL | Status: DC
  Administered 2020-05-05: 50 mg via ORAL
  Filled 2020-05-05: qty 1

## 2020-05-05 MED ORDER — HYDROCOD POLST-CPM POLST ER 10-8 MG/5ML PO SUER: 5.0000 mL | Freq: Two times a day (BID) | ORAL | Status: DC | PRN

## 2020-05-05 MED ORDER — TRAZODONE HCL 50 MG PO TABS: 50.0000 mg | ORAL_TABLET | Freq: Every evening | ORAL | Status: DC | PRN

## 2020-05-05 MED ORDER — IVERMECTIN 3 MG PO TABS: 150.0000 ug/kg | ORAL_TABLET | Freq: Once | ORAL | Status: DC

## 2020-05-05 MED ORDER — ZINC SULFATE 220 (50 ZN) MG PO CAPS
220.0000 mg | ORAL_CAPSULE | Freq: Every day | ORAL | Status: DC
  Administered 2020-05-05: 220 mg via ORAL
  Filled 2020-05-05: qty 1

## 2020-05-05 MED ORDER — IVERMECTIN 3 MG PO TABS: 150.0000 ug/kg | ORAL_TABLET | Freq: Every day | ORAL | Status: DC

## 2020-05-05 MED ORDER — LACTATED RINGERS IV SOLN: INTRAVENOUS | Status: DC

## 2020-05-05 MED ORDER — INSULIN ASPART 100 UNIT/ML ~~LOC~~ SOLN: 0.0000 [IU] | Freq: Every day | SUBCUTANEOUS | Status: DC

## 2020-05-05 MED ORDER — SODIUM CHLORIDE 0.9 % IV SOLN
2.0000 g | INTRAVENOUS | Status: DC
  Administered 2020-05-05: 2 g via INTRAVENOUS
  Filled 2020-05-05 (×2): qty 20

## 2020-05-05 MED ORDER — ONDANSETRON HCL 4 MG/2ML IJ SOLN: 4.0000 mg | Freq: Four times a day (QID) | INTRAMUSCULAR | Status: DC | PRN

## 2020-05-05 MED ORDER — SODIUM CHLORIDE 0.9 % IV SOLN
500.0000 mg | INTRAVENOUS | Status: DC
  Filled 2020-05-05: qty 500

## 2020-05-05 MED ORDER — VITAMIN D 25 MCG (1000 UNIT) PO TABS
1000.0000 [IU] | ORAL_TABLET | Freq: Every day | ORAL | Status: DC
  Administered 2020-05-05: 1000 [IU] via ORAL
  Filled 2020-05-05: qty 1

## 2020-05-05 MED ORDER — AMLODIPINE BESYLATE 5 MG PO TABS
5.0000 mg | ORAL_TABLET | Freq: Every day | ORAL | Status: DC
  Administered 2020-05-05: 5 mg via ORAL
  Filled 2020-05-05: qty 1

## 2020-05-05 MED ORDER — ESCITALOPRAM OXALATE 10 MG PO TABS
10.0000 mg | ORAL_TABLET | Freq: Every day | ORAL | Status: DC
  Administered 2020-05-05: 10 mg via ORAL
  Filled 2020-05-05 (×2): qty 1

## 2020-05-05 MED ORDER — ASPIRIN EC 81 MG PO TBEC
81.0000 mg | DELAYED_RELEASE_TABLET | Freq: Every day | ORAL | Status: DC
  Administered 2020-05-05: 81 mg via ORAL
  Filled 2020-05-05: qty 1

## 2020-05-05 MED ORDER — LORAZEPAM 2 MG/ML IJ SOLN
0.5000 mg | INTRAMUSCULAR | Status: DC | PRN
  Administered 2020-05-05: 0.5 mg via INTRAVENOUS
  Filled 2020-05-05: qty 1

## 2020-05-05 MED ORDER — DEXAMETHASONE 4 MG PO TABS
6.0000 mg | ORAL_TABLET | ORAL | Status: DC
  Administered 2020-05-05: 6 mg via ORAL
  Filled 2020-05-05: qty 1.5

## 2020-05-05 MED ORDER — POTASSIUM CHLORIDE 20 MEQ PO PACK
40.0000 meq | PACK | Freq: Once | ORAL | Status: AC
  Administered 2020-05-05: 40 meq via ORAL
  Filled 2020-05-05: qty 2

## 2020-05-05 MED ORDER — SODIUM CHLORIDE 0.9 % IV BOLUS
1000.0000 mL | Freq: Once | INTRAVENOUS | Status: AC
  Administered 2020-05-05: 1000 mL via INTRAVENOUS

## 2020-05-05 MED ORDER — HYDROCHLOROTHIAZIDE 12.5 MG PO CAPS
12.5000 mg | ORAL_CAPSULE | Freq: Every day | ORAL | Status: DC
  Administered 2020-05-05: 12.5 mg via ORAL
  Filled 2020-05-05: qty 1

## 2020-05-05 MED ORDER — ENOXAPARIN SODIUM 40 MG/0.4ML ~~LOC~~ SOLN
40.0000 mg | SUBCUTANEOUS | Status: DC
  Administered 2020-05-05: 40 mg via SUBCUTANEOUS
  Filled 2020-05-05: qty 0.4

## 2020-05-05 MED ORDER — ONDANSETRON HCL 4 MG PO TABS: 4.0000 mg | ORAL_TABLET | Freq: Four times a day (QID) | ORAL | Status: DC | PRN

## 2020-05-05 MED ORDER — INSULIN ASPART PROT & ASPART (70-30 MIX) 100 UNIT/ML ~~LOC~~ SUSP
35.0000 [IU] | Freq: Two times a day (BID) | SUBCUTANEOUS | Status: DC
  Administered 2020-05-05 (×2): 35 [IU] via SUBCUTANEOUS
  Filled 2020-05-05 (×2): qty 10

## 2020-05-05 MED ORDER — INSULIN ASPART 100 UNIT/ML ~~LOC~~ SOLN
0.0000 [IU] | Freq: Three times a day (TID) | SUBCUTANEOUS | Status: DC
  Administered 2020-05-05: 20 [IU] via SUBCUTANEOUS
  Filled 2020-05-05: qty 1

## 2020-05-05 MED ORDER — IPRATROPIUM-ALBUTEROL 20-100 MCG/ACT IN AERS
1.0000 | INHALATION_SPRAY | Freq: Four times a day (QID) | RESPIRATORY_TRACT | Status: DC
  Administered 2020-05-05 (×2): 1 via RESPIRATORY_TRACT
  Filled 2020-05-05: qty 4

## 2020-05-05 MED ORDER — ACETAMINOPHEN 325 MG PO TABS: 650.0000 mg | ORAL_TABLET | Freq: Four times a day (QID) | ORAL | Status: DC | PRN

## 2020-05-05 MED ORDER — ASCORBIC ACID 500 MG PO TABS
500.0000 mg | ORAL_TABLET | Freq: Every day | ORAL | Status: DC
  Administered 2020-05-05: 500 mg via ORAL
  Filled 2020-05-05: qty 1

## 2020-05-05 MED ORDER — INSULIN ASPART 100 UNIT/ML ~~LOC~~ SOLN
15.0000 [IU] | Freq: Once | SUBCUTANEOUS | Status: AC
  Administered 2020-05-05: 15 [IU] via SUBCUTANEOUS
  Filled 2020-05-05: qty 1

## 2020-05-05 MED ORDER — FAMOTIDINE IN NACL 20-0.9 MG/50ML-% IV SOLN
20.0000 mg | Freq: Two times a day (BID) | INTRAVENOUS | Status: DC
  Administered 2020-05-05 (×2): 20 mg via INTRAVENOUS
  Filled 2020-05-05 (×2): qty 50

## 2020-05-05 MED ORDER — SODIUM CHLORIDE 0.9 % IV SOLN: 100.0000 mg | Freq: Every day | INTRAVENOUS | Status: DC

## 2020-05-05 MED ORDER — GUAIFENESIN-DM 100-10 MG/5ML PO SYRP
10.0000 mL | ORAL_SOLUTION | ORAL | Status: DC | PRN
  Filled 2020-05-05: qty 10

## 2020-05-05 MED ORDER — METHYLPREDNISOLONE SODIUM SUCC 40 MG IJ SOLR
40.0000 mg | Freq: Two times a day (BID) | INTRAMUSCULAR | Status: DC
  Administered 2020-05-05: 40 mg via INTRAVENOUS
  Filled 2020-05-05: qty 1

## 2020-05-05 NOTE — ED Notes (Signed)
Per MD order, start sliding scale and bg checks now.

## 2020-05-05 NOTE — ED Notes (Signed)
Pt given incentive spirometer, pt given instruction on use, pt demonstrates accurate use.

## 2020-05-05 NOTE — ED Notes (Signed)
Pt sat's decreased to 75% on room air. Pt placed back on O2 [place], reminded to leave it on. Pt agrees.

## 2020-05-05 NOTE — Progress Notes (Signed)
PROGRESS NOTE    Walter [name]  KDX:[id number] DOB: Sep 04, 1966 DOA: 05/04/2020 PCP: Gaspar Garbe, MD   Brief Narrative:  Walter [name]  is a 54 y.o. obese Caucasian male with a known history of hypertension and type diabetes mellitus was anxiety who presented to the emergency room with acute onset of shortness of breath since Thursday with associated cough mainly dry and occasionally productive of gray sputum as well as occasional wheezing.  He had fever and chills a couple of days last week.  Noted to diarrhea without nausea or vomiting.  He has been having loss of taste and smell. Patient is unvaccinated and found to be positive for COVID-19 PCR. Chest x-ray with bilateral airspace opacities consistent with COVID-19 pneumonia. Procalcitonin positive at 0.23. He was started on empiric antibiotics along with remdesivir and steroid.  Subjective: Patient continued to remain little short of breath and coughing. He lives with his 13 year old son who is currently asymptomatic.  Assessment & Plan:   Active Problems:   Pneumonia due to COVID-19 virus  Acute hypoxic respiratory failure secondary to COVID-19 pneumonia. Elevated inflammatory markers, procalcitonin positive at 0.23, most likely some superadded bacterial infection. Patient was on 15 L when seen today. Appears comfortable and speaking in full sentences. CRP at 7.3. -Continue remdesivir--day 2 -Switch Decadron for Solu Medrol 40 mg twice daily. -Patient is a good candidate for Actemra-no availability at this time. -We will consider baricitinib once available, may be tomorrow. -Continue to monitor inflammatory markers. -Continue supportive care with supplemental oxygen. -Continue multivitamins -IV fluid as patient with poor p.o. intake. -Continue ceftriaxone and Zithromax due to possible superadded bacterial infection.  Electrolyte abnormalities. Patient had hypokalemia and hyponatremia on initial presentation which has been  resolved with IV fluid and electrolyte repletion. -Continue to follow.  Type 2 diabetes mellitus. Patient uses 70/30 at home. Which was continued at 35 units twice daily. Elevated CBG. -Add sliding scale. -Check A1c  Hypertension. Blood pressure currently within goal. -Continue home dose of amlodipine, Hyzaar and Toprol XL.  Depression. No acute concern. -Continue home dose of Celexa.  Objective: Vitals:   05/05/20 1222 05/05/20 1227 05/05/20 1304 05/05/20 1330  BP:  (!) 134/57 123/60 130/72  Pulse: 74 71 73 68  Resp: 20 (!) 21 17 20   Temp:      TempSrc:      SpO2: 91% 92% 91% 94%  Weight:      Height:        Intake/Output Summary (Last 24 hours) at 05/05/2020 1419 Last data filed at 05/05/2020 1336 Gross per 24 hour  Intake 1974.17 ml  Output --  Net 1974.17 ml   Filed Weights   05/05/20 0428  Weight: 117 kg    Examination:  General exam: Obese gentleman, appears calm and comfortable  Respiratory system: Bilateral scattered rhonchi with decreased air entry, respiratory effort normal. Cardiovascular system: S1 & S2 heard, RRR. No JVD, murmurs, Gastrointestinal system: Soft, nontender, nondistended, bowel sounds positive. Central nervous system: Alert and oriented. No focal neurological deficits. Extremities: No edema, no cyanosis, pulses intact and symmetrical. Psychiatry: Judgement and insight appear normal. Mood & affect appropriate.    DVT prophylaxis: Lovenox Code Status: Full Family Communication: Discussed with patient Disposition Plan:  Status is: Inpatient  Remains inpatient appropriate because:Inpatient level of care appropriate due to severity of illness   Dispo: The patient is from: Home              Anticipated d/c is to: Home  Anticipated d/c date is: 3 days              Patient currently is not medically stable to d/c.  Consultants:   None  Procedures:  Antimicrobials:  Zithromax Ceftriaxone.  Data Reviewed: I have  personally reviewed following labs and imaging studies  CBC: Recent Labs  Lab 05/04/20 2313  WBC 7.9  HGB 13.7  HCT 41.0  MCV 83.8  PLT 154   Basic Metabolic Panel: Recent Labs  Lab 05/04/20 2313 05/05/20 0411 05/05/20 1228  NA 128*  --  133*  K 3.3*  --  3.8  CL 91*  --  97*  CO2 23  --  23  GLUCOSE 258*  --  450*  BUN 21*  --  25*  CREATININE 1.30*  --  1.16  CALCIUM 7.8*  --  7.9*  MG  --  2.1  --    GFR: Estimated Creatinine Clearance: 90.4 mL/min (by C-G formula based on SCr of 1.16 mg/dL). Liver Function Tests: Recent Labs  Lab 05/05/20 1228  AST 44*  ALT 29  ALKPHOS 53  BILITOT 1.1  PROT 7.1  ALBUMIN 3.0*   No results for input(s): LIPASE, AMYLASE in the last 168 hours. No results for input(s): AMMONIA in the last 168 hours. Coagulation Profile: Recent Labs  Lab 05/04/20 2313  INR 1.0   Cardiac Enzymes: No results for input(s): CKTOTAL, CKMB, CKMBINDEX, TROPONINI in the last 168 hours. BNP (last 3 results) No results for input(s): PROBNP in the last 8760 hours. HbA1C: No results for input(s): HGBA1C in the last 72 hours. CBG: Recent Labs  Lab 05/05/20 0935 05/05/20 1347  GLUCAP 393* 437*   Lipid Profile: Recent Labs    05/04/20 2313  TRIG 79   Thyroid Function Tests: No results for input(s): TSH, T4TOTAL, FREET4, T3FREE, THYROIDAB in the last 72 hours. Anemia Panel: Recent Labs    05/04/20 2313 05/05/20 0411  FERRITIN 547* 580*   Sepsis Labs: Recent Labs  Lab 05/04/20 2313 05/05/20 0500  PROCALCITON 0.23 0.23  LATICACIDVEN 1.6  --     Recent Results (from the past 240 hour(s))  Blood Culture (routine x 2)     Status: None (Preliminary result)   Collection Time: 05/04/20 11:13 PM   Specimen: BLOOD  Result Value Ref Range Status   Specimen Description BLOOD RFOA  Final   Special Requests BOTTLES DRAWN AEROBIC AND ANAEROBIC BCAV  Final   Culture   Final    NO GROWTH < 12 HOURS Performed at [facility], [address], [place], [place] [address]    Report Status PENDING  Incomplete  Blood Culture (routine x 2)     Status: None (Preliminary result)   Collection Time: 05/04/20 11:13 PM   Specimen: BLOOD  Result Value Ref Range Status   Specimen Description BLOOD LAC  Final   Special Requests BOTTLES DRAWN AEROBIC AND ANAEROBIC BCAV  Final   Culture   Final    NO GROWTH < 12 HOURS Performed at [facility], [address] [place], [place] [address]    Report Status PENDING  Incomplete     Radiology Studies: DG Chest 1 View  Result Date: 05/04/2020 CLINICAL DATA:  Shortness of breath history of COVID EXAM: CHEST  1 VIEW COMPARISON:  05/01/2015 FINDINGS: Patchy bilateral vague airspace opacities and consolidations. Enlarged cardiomediastinal silhouette with mild central vascular congestion. No pleural effusion or pneumothorax. IMPRESSION: Patchy bilateral airspace opacities and consolidations concerning for  bilateral pneumonia. There is cardiomegaly with mild central vascular congestion. Electronically Signed   By: Jasmine Pang M.D.   On: 05/04/2020 23:11    Scheduled Meds: . amLODipine  5 mg Oral Daily  . vitamin C  500 mg Oral Daily  . aspirin EC  81 mg Oral Daily  . cholecalciferol  1,000 Units Oral Daily  . dexamethasone  6 mg Oral Q24H  . docusate sodium  100 mg Oral BID  . enoxaparin (LOVENOX) injection  40 mg Subcutaneous Q24H  . escitalopram  10 mg Oral Daily  . losartan  100 mg Oral Daily   And  . hydrochlorothiazide  12.5 mg Oral Daily  . insulin aspart  0-15 Units Subcutaneous TID WC  . insulin aspart  0-5 Units Subcutaneous QHS  . insulin aspart protamine- aspart  35 Units Subcutaneous BID WC  . Ipratropium-Albuterol  1 puff Inhalation Q6H  . metoprolol succinate  50 mg Oral Daily  . zinc sulfate  220 mg Oral Daily   Continuous Infusions: . azithromycin    . cefTRIAXone (ROCEPHIN)  IV Stopped (05/05/20 1336)  . famotidine (PEPCID) IV Stopped (05/05/20 1208)   . lactated ringers    . remdesivir 100 mg in NS 100 mL Stopped (05/05/20 1221)     LOS: 0 days   Time spent: 45 minutes.  Arnetha Courser, MD [facility]  If 7PM-7AM, please contact night-coverage [email or web address]  05/05/2020, 2:19 PM   This record has been created using Conservation officer, historic buildings. Errors have been sought and corrected,but may not always be located. Such creation errors do not reflect on the standard of care.

## 2020-05-05 NOTE — ED Notes (Signed)
No novolog in pyxis, pharmacy notified and to send.

## 2020-05-05 NOTE — ED Notes (Signed)
Pt MD Amin, give pt 20 units novolog for sliding scale

## 2020-05-05 NOTE — ED Notes (Signed)
Upon arrival in room pt had removed O2 [place] and all leads. Pt reminded to leave all leads and O 2 on, pt could not remember that he was in ED. Pt oriented to self, situation, and year, pt forgetful about exact place.

## 2020-05-05 NOTE — ED Notes (Signed)
Pt given sandwich tray and diet soda, pt encouraged to eat and drink. Self proning explained to pt. Pt states he does not feel SOB but has no taste. Pt agrees to try self proning after eating.

## 2020-05-05 NOTE — Progress Notes (Signed)
Cross Cover Brief Note Nurse reports patient agitated, pulled his IV out and refused. Patient adamant that he wants to leave.  I explained to patient risk of decliine and death should he leave the hospital and not continue treatment. Patient claims to understand and he signed appropriate paperwork.   Administrator aware

## 2020-05-05 NOTE — ED Notes (Signed)
Pt given diet cola. Pt comfortable at this time.

## 2020-05-05 NOTE — ED Notes (Signed)
Bed assignment removed. Pt on side of bed after removing O2 and leads. Pt leads and O2 replaced, pt reminded to leave all leads on. Pt states he is aware he is sick due to covid and agrees to leave leads and O2 on. Pt does not remember where he is but is oriented to self and time.

## 2020-05-05 NOTE — Progress Notes (Signed)
Inpatient Diabetes Program Recommendations  AACE/ADA: New Consensus Statement on Inpatient Glycemic Control   Target Ranges:  Prepandial:   less than 140 mg/dL      Peak postprandial:   less than 180 mg/dL (1-2 hours)      Critically ill patients:  140 - 180 mg/dL  Results for Walter, [name] (MRN [id number]) as of 05/05/2020 12:18  Ref. Range 05/05/2020 09:35  Glucose-Capillary Latest Ref Range: 70 - 99 mg/dL 505 (H)    Review of Glycemic Control  Diabetes history: DM2 Outpatient Diabetes medications: 70/30 70 units TID with meals Current orders for Inpatient glycemic control:  70/30 35 units BID; Decadron 6 mg daily  Inpatient Diabetes Program Recommendations:    Insulin-Please consider ordering CBGs AC&HS with Novolog 0-15 units TID with meals and Novolog 0-5 units QHS.  HbgA1C: Please consider ordering an A1C to evaluate glycemic control over the past 2-3 months.  Thanks, Orlando Penner, RN, MSN, CDE Diabetes Coordinator Inpatient Diabetes Program [phone] (Team Pager from 8am to 5pm)

## 2020-05-05 NOTE — ED Notes (Signed)
MD Nelson Chimes contacted due to pt's bg 472 for sliding scale dosing. Waiting for response.

## 2020-05-05 NOTE — ED Notes (Signed)
Pt up with assist to bsc for small bath with wipes and for bm. sats dropped to low 80's with 15L, encouraged pt to deep breathe thru nose, sats upper 80's. Tolerated activity fair.

## 2020-05-05 NOTE — ED Notes (Addendum)
Tameka RN given report

## 2020-05-05 NOTE — Progress Notes (Signed)
This nurse attempted to educate the patient on Covid safety and fall prevention. Patient refused to comply with recommended  precautions and medication administration. Patient become uncooperative with direction and repeatedly attempted to leave the room and unit.  Refusing iv insertion or treatment plan, patient ultimately decided to leave AMA.

## 2020-05-05 NOTE — H&P (Addendum)
[place] at [facility]   PATIENT NAME: Walter [name]   . MR#:  [id number].  DATE OF BIRTH:  1966/06/07  DATE OF ADMISSION:  05/04/2020  PRIMARY CARE PHYSICIAN: Tisovec, Adelfa Koh, MD   REQUESTING/REFERRING PHYSICIAN: Bayard Males, MD CHIEF COMPLAINT:   Chief Complaint  Patient presents with  . Shortness of Breath    HISTORY OF PRESENT ILLNESS:  Walter [name]  is a 54 y.o. obese Caucasian male with a known history of hypertension and type diabetes mellitus was anxiety who presented to the emergency room with acute onset of shortness of breath since Thursday with associated cough mainly dry and occasionally productive of gray sputum as well as occasional wheezing.  He had fever and chills a couple of days last week.  Noted to diarrhea without nausea or vomiting.  He has been having loss of taste and smell.  No dysuria, oliguria or hematuria or flank pain.  The patient is unvaccinated for COVID-19.  Upon presentation to the emergency room, blood pressure was 132/53 and pulse 79% on 4 L of O2 by nasal cannula and he was titrated to the high flow nasal cannula at 7 L/min.  Labs revealed hyponatremia and hypochloremia and hyper Bulk[place] anemia, hyperglycemia and creatinine 1.3.  BNP was 30.1 LDH was elevated to 54 with ferritin of 547.  Lactic acid was 1.6 and procalcitonin 0.23.  CBC was unremarkable.  Fibrinogen was 737 and fibrin derivatives D-dimer 825.68.  Blood cultures were drawn.  1 view chest x-ray showed patchy bilateral airspace opacities and consolidations concerning for bilateral pneumonia with cardiomegaly with mild central vascular congestion.  COVID-19 PCR came back positive.  The patient was given IV remdesivir, Zithromax and ceftriaxone, 10 mg IV Decadron and 1 L bolus of IV normal saline.  He will be admitted to a progressive unit bed for further evaluation and management.  PAST MEDICAL HISTORY:   Past Medical History:  Diagnosis Date  . Anxiety   . Diabetes  mellitus without complication (HCC)   . Hypertension     PAST SURGICAL HISTORY:   Past Surgical History:  Procedure Laterality Date  . CHOLECYSTECTOMY      SOCIAL HISTORY:   Social History   Tobacco Use  . Smoking status: Former Games developer  . Smokeless tobacco: Never Used  Substance Use Topics  . Alcohol use: No    FAMILY HISTORY:  History reviewed. No pertinent family history.  No pertinent familial diseases.  DRUG ALLERGIES:  No Known Allergies  REVIEW OF SYSTEMS:   ROS As per history of present illness. All pertinent systems were reviewed above. Constitutional, HEENT, cardiovascular, respiratory, GI, GU, musculoskeletal, neuro, psychiatric, endocrine, integumentary and hematologic systems were reviewed and are otherwise negative/unremarkable except for positive findings mentioned above in the HPI.   MEDICATIONS AT HOME:   Prior to Admission medications   Medication Sig Start Date End Date Taking? Authorizing Provider  amLODipine (NORVASC) 5 MG tablet Take 5 mg by mouth daily.    [provider]  citalopram (CELEXA) 20 MG tablet Take 20 mg by mouth daily as needed (for anxiety.).    [provider]  escitalopram (LEXAPRO) 10 MG tablet Take 1 tablet (10 mg total) by mouth daily. 05/01/15 04/30/16  Emily Filbert, MD  insulin NPH-regular Human (NOVOLIN 70/30) (70-30) 100 UNIT/ML injection Inject 70 Units into the skin 3 (three) times daily.    [provider]  losartan-hydrochlorothiazide (HYZAAR) 100-12.5 MG per tablet Take 1 tablet by  mouth daily.    [provider]  metoprolol succinate (TOPROL-XL) 50 MG 24 hr tablet Take 50 mg by mouth daily. Take with or immediately following a meal.    [provider]      VITAL SIGNS:  Blood pressure 125/63, pulse 79, temperature 99.3 F (37.4 C), temperature source Oral, resp. rate (!) 25, SpO2 (!) 88 %.  PHYSICAL EXAMINATION:  Physical Exam  GENERAL:  54 y.o.-year-old patient  lying in the bed with mild respiratory distress with conversational dyspnea EYES: Pupils equal, round, reactive to light and accommodation. No scleral icterus. Extraocular muscles intact.  HEENT: Head atraumatic, normocephalic. Oropharynx and nasopharynx clear.  NECK:  Supple, no jugular venous distention. No thyroid enlargement, no tenderness.  LUNGS: Diminished bibasal breath sounds with bibasal and midlung zone crackles. CARDIOVASCULAR: Regular rate and rhythm, S1, S2 normal. No murmurs, rubs, or gallops.  ABDOMEN: Soft, nondistended, nontender. Bowel sounds present. No organomegaly or mass.  EXTREMITIES: 1+ bilateral lower extremity pitting edema, with no cyanosis, or clubbing.  NEUROLOGIC: Cranial nerves II through XII are intact. Muscle strength 5/5 in all extremities. Sensation intact. Gait not checked.  PSYCHIATRIC: The patient is alert and oriented x 3.  Normal affect and good eye contact. SKIN: No obvious rash, lesion, or ulcer.   LABORATORY PANEL:   CBC Recent Labs  Lab 05/04/20 2313  WBC 7.9  HGB 13.7  HCT 41.0  PLT 154   ------------------------------------------------------------------------------------------------------------------  Chemistries  Recent Labs  Lab 05/04/20 2313  NA 128*  K 3.3*  CL 91*  CO2 23  GLUCOSE 258*  BUN 21*  CREATININE 1.30*  CALCIUM 7.8*   ------------------------------------------------------------------------------------------------------------------  Cardiac Enzymes No results for input(s): TROPONINI in the last 168 hours. ------------------------------------------------------------------------------------------------------------------  RADIOLOGY:  DG Chest 1 View  Result Date: 05/04/2020 CLINICAL DATA:  Shortness of breath history of COVID EXAM: CHEST  1 VIEW COMPARISON:  05/01/2015 FINDINGS: Patchy bilateral vague airspace opacities and consolidations. Enlarged cardiomediastinal silhouette with mild central vascular  congestion. No pleural effusion or pneumothorax. IMPRESSION: Patchy bilateral airspace opacities and consolidations concerning for bilateral pneumonia. There is cardiomegaly with mild central vascular congestion. Electronically Signed   By: Jasmine Pang M.D.   On: 05/04/2020 23:11      IMPRESSION AND PLAN:  1.  Acute hypoxemic respiratory failure secondary to COVID-19. -The patient will be admitted to a medically monitored isolation bed. -O2 protocol will be followed to keep O2 saturation above 93.   2.  Multifocal pneumonia secondary to COVID-19. -The patient will be admitted to an isolation monitored bed with droplet and contact precautions. -Given multifocal pneumonia we will empirically place the patient on IV Rocephin and Zithromax for possible bacterial superinfection only with elevated Procalcitonin. -The patient will be placed on scheduled Mucinex and as needed Tussionex. -We will avoid nebulization as much as we can, give bronchodilator MDI if needed, and with deterioration of oxygenation try to avoid BiPAP/CPAP if possible.    -Will obtain sputum Gram stain culture and sensitivity and follow blood cultures. -O2 protocol will be followed. -We will follow CRP, ferritin, LDH and D-dimer. -Will follow manual differential for ANC/ALC ratio as well as follow troponin I and daily CBC with manual differential and CMP. - Will place the patient on IV Remdesivir and IV steroid therapy with Decadron with elevated inflammatory markers. -I discussed ivermectin with the patient and he agreed to proceed with it.   -The patient will be placed on vitamin D3, vitamin C, zinc sulfate, p.o. Pepcid  and aspirin. -Actemra can be considered for CRP more than 7 with associated hypoxemia.  3.  Hypokalemia and hyponatremia, likely hypovolemic  -Potassium will be replaced and magnesium level will be checked. -The patient will be gently diuresed with IV normal saline. -We will follow BMP.  4.  Type 2  diabetes mellitus. -We will continue basal coverage with NovoLog 70/30 and place the patient on supplement coverage with NovoLog.   5.  Hypertension. -We will continue amlodipine, Hyzaar and Toprol-XL.  6.  Depression. -We will continue Celexa.  7.  DVT prophylaxis. -Subcutaneous Lovenox.    All the records are reviewed and case discussed with ED provider. The plan of care was discussed in details with the patient (and family). I answered all questions. The patient agreed to proceed with the above mentioned plan. Further management will depend upon hospital course.   CODE STATUS: Full code  Status is: Inpatient  Remains inpatient appropriate because:Hemodynamically unstable, Ongoing diagnostic testing needed not appropriate for outpatient work up, Unsafe d/c plan, IV treatments appropriate due to intensity of illness or inability to take PO and Inpatient level of care appropriate due to severity of illness   Dispo: The patient is from: Home              Anticipated d/c is to: Home              Anticipated d/c date is: 3 days              Patient currently is not medically stable for discharge   TOTAL TIME TAKING CARE OF THIS PATIENT: 55 minutes.    Hannah Beat M.D on 05/05/2020 at 2:03 AM  [facility]   From 7 PM-7 AM, contact night-coverage [email or web address]  CC: Primary care physician; Tisovec, Adelfa Koh, MD   Note: This dictation was prepared with Dragon dictation along with smaller phrase technology. Any transcriptional typo errors that result from this process are unintentional.

## 2020-05-05 NOTE — ED Notes (Signed)
Pt remaining in bed, leaving O2 on at present. Given verbal reassurance

## 2020-05-06 ENCOUNTER — Emergency Department: Admission: EM | Admit: 2020-05-06 | Discharge: 2020-05-06 | Discharge status: Absent without leave | Payer: Self-pay

## 2020-05-06 ENCOUNTER — Emergency Department: Payer: HRSA Program

## 2020-05-06 ENCOUNTER — Inpatient Hospital Stay
Admission: EM | Admit: 2020-05-06 | Discharge: 2020-05-10 | DRG: 177 | Disposition: A | Discharge status: Home / Self Care | Payer: HRSA Program | Attending: Internal Medicine | Admitting: Internal Medicine

## 2020-05-06 ENCOUNTER — Encounter: Payer: Self-pay | Admitting: Internal Medicine

## 2020-05-06 ENCOUNTER — Other Ambulatory Visit: Payer: Self-pay

## 2020-05-06 DIAGNOSIS — F329 Major depressive disorder, single episode, unspecified: Secondary | ICD-10-CM | POA: Diagnosis present

## 2020-05-06 DIAGNOSIS — J1282 Pneumonia due to coronavirus disease 2019: Secondary | ICD-10-CM | POA: Diagnosis present

## 2020-05-06 DIAGNOSIS — U071 COVID-19: Secondary | ICD-10-CM | POA: Diagnosis present

## 2020-05-06 DIAGNOSIS — I10 Essential (primary) hypertension: Secondary | ICD-10-CM | POA: Diagnosis present

## 2020-05-06 DIAGNOSIS — N179 Acute kidney failure, unspecified: Secondary | ICD-10-CM | POA: Diagnosis present

## 2020-05-06 DIAGNOSIS — E872 Acidosis: Secondary | ICD-10-CM | POA: Diagnosis present

## 2020-05-06 DIAGNOSIS — Z794 Long term (current) use of insulin: Secondary | ICD-10-CM

## 2020-05-06 DIAGNOSIS — E1165 Type 2 diabetes mellitus with hyperglycemia: Secondary | ICD-10-CM | POA: Diagnosis present

## 2020-05-06 DIAGNOSIS — E876 Hypokalemia: Secondary | ICD-10-CM | POA: Diagnosis present

## 2020-05-06 DIAGNOSIS — Z87891 Personal history of nicotine dependence: Secondary | ICD-10-CM | POA: Diagnosis not present

## 2020-05-06 DIAGNOSIS — Z6841 Body Mass Index (BMI) 40.0 and over, adult: Secondary | ICD-10-CM | POA: Diagnosis not present

## 2020-05-06 DIAGNOSIS — E119 Type 2 diabetes mellitus without complications: Secondary | ICD-10-CM | POA: Diagnosis not present

## 2020-05-06 DIAGNOSIS — F32A Depression, unspecified: Secondary | ICD-10-CM | POA: Diagnosis present

## 2020-05-06 DIAGNOSIS — J9601 Acute respiratory failure with hypoxia: Secondary | ICD-10-CM | POA: Diagnosis present

## 2020-05-06 LAB — GLUCOSE, CAPILLARY
Glucose-Capillary: 316 mg/dL — ABNORMAL HIGH (ref 70–99)
Glucose-Capillary: 344 mg/dL — ABNORMAL HIGH (ref 70–99)
Glucose-Capillary: 390 mg/dL — ABNORMAL HIGH (ref 70–99)
Glucose-Capillary: 427 mg/dL — ABNORMAL HIGH (ref 70–99)

## 2020-05-06 LAB — FERRITIN: Ferritin: 791 ng/mL — ABNORMAL HIGH (ref 24–336)

## 2020-05-06 LAB — FIBRINOGEN: Fibrinogen: 675 mg/dL — ABNORMAL HIGH (ref 210–475)

## 2020-05-06 LAB — COMPREHENSIVE METABOLIC PANEL
ALT: 30 U/L (ref 0–44)
AST: 51 U/L — ABNORMAL HIGH (ref 15–41)
Albumin: 3 g/dL — ABNORMAL LOW (ref 3.5–5.0)
Alkaline Phosphatase: 54 U/L (ref 38–126)
Anion gap: 13 (ref 5–15)
BUN: 35 mg/dL — ABNORMAL HIGH (ref 6–20)
CO2: 23 mmol/L (ref 22–32)
Calcium: 8 mg/dL — ABNORMAL LOW (ref 8.9–10.3)
Chloride: 98 mmol/L (ref 98–111)
Creatinine, Ser: 1.16 mg/dL (ref 0.61–1.24)
GFR calc Af Amer: >60 mL/min (ref >60)
GFR calc non Af Amer: >60 mL/min (ref >60)
Glucose, Bld: 457 mg/dL — ABNORMAL HIGH (ref 70–99)
Potassium: 4.3 mmol/L (ref 3.5–5.1)
Sodium: 134 mmol/L — ABNORMAL LOW (ref 135–145)
Total Bilirubin: 1 mg/dL (ref 0.3–1.2)
Total Protein: 7.2 g/dL (ref 6.5–8.1)

## 2020-05-06 LAB — TROPONIN I (HIGH SENSITIVITY): Troponin I (High Sensitivity): 10 ng/L (ref <18)

## 2020-05-06 LAB — CBC
HCT: 41.7 % (ref 39.0–52.0)
Hemoglobin: 14.6 g/dL (ref 13.0–17.0)
MCH: 28.8 pg (ref 26.0–34.0)
MCHC: 35 g/dL (ref 30.0–36.0)
MCV: 82.2 fL (ref 80.0–100.0)
Platelets: 270 10*3/uL (ref 150–400)
RBC: 5.07 MIL/uL (ref 4.22–5.81)
RDW: 14 % (ref 11.5–15.5)
WBC: 13.3 10*3/uL — ABNORMAL HIGH (ref 4.0–10.5)
nRBC: 0 % (ref 0.0–0.2)

## 2020-05-06 LAB — FIBRIN DERIVATIVES D-DIMER (ARMC ONLY): Fibrin derivatives D-dimer (ARMC): 1269.08 ng/mL (FEU) — ABNORMAL HIGH (ref 0.00–499.00)

## 2020-05-06 LAB — PROCALCITONIN: Procalcitonin: 0.35 ng/mL

## 2020-05-06 LAB — TRIGLYCERIDES: Triglycerides: 122 mg/dL (ref <150)

## 2020-05-06 LAB — C-REACTIVE PROTEIN: CRP: 4.7 mg/dL — ABNORMAL HIGH (ref <1.0)

## 2020-05-06 LAB — LACTATE DEHYDROGENASE: LDH: 341 U/L — ABNORMAL HIGH (ref 98–192)

## 2020-05-06 LAB — LACTIC ACID, PLASMA
Lactic Acid, Venous: 2.1 mmol/L (ref 0.5–1.9)
Lactic Acid, Venous: 2.2 mmol/L (ref 0.5–1.9)

## 2020-05-06 MED ORDER — AMLODIPINE BESYLATE 5 MG PO TABS
5.0000 mg | ORAL_TABLET | Freq: Every day | ORAL | Status: DC
  Administered 2020-05-07 – 2020-05-08 (×2): 5 mg via ORAL
  Filled 2020-05-06 (×2): qty 1

## 2020-05-06 MED ORDER — BARICITINIB 2 MG PO TABS
4.0000 mg | ORAL_TABLET | Freq: Every day | ORAL | Status: DC
  Administered 2020-05-06 – 2020-05-10 (×5): 4 mg via ORAL
  Filled 2020-05-06 (×6): qty 2

## 2020-05-06 MED ORDER — ACETAMINOPHEN 325 MG PO TABS: 650.0000 mg | ORAL_TABLET | Freq: Four times a day (QID) | ORAL | Status: DC | PRN

## 2020-05-06 MED ORDER — INSULIN ASPART 100 UNIT/ML ~~LOC~~ SOLN: 0.0000 [IU] | Freq: Three times a day (TID) | SUBCUTANEOUS | Status: DC

## 2020-05-06 MED ORDER — HYDROXYZINE HCL 50 MG/ML IM SOLN
25.0000 mg | Freq: Four times a day (QID) | INTRAMUSCULAR | Status: DC | PRN
  Filled 2020-05-06: qty 0.5

## 2020-05-06 MED ORDER — HYDROCHLOROTHIAZIDE 12.5 MG PO CAPS: 12.5000 mg | ORAL_CAPSULE | Freq: Every day | ORAL | Status: DC

## 2020-05-06 MED ORDER — FAMOTIDINE IN NACL 20-0.9 MG/50ML-% IV SOLN
20.0000 mg | Freq: Two times a day (BID) | INTRAVENOUS | Status: DC
  Administered 2020-05-06 – 2020-05-09 (×5): 20 mg via INTRAVENOUS
  Filled 2020-05-06 (×6): qty 50

## 2020-05-06 MED ORDER — INSULIN ASPART 100 UNIT/ML ~~LOC~~ SOLN: 0.0000 [IU] | Freq: Every day | SUBCUTANEOUS | Status: DC

## 2020-05-06 MED ORDER — DM-GUAIFENESIN ER 30-600 MG PO TB12: 1.0000 | ORAL_TABLET | Freq: Two times a day (BID) | ORAL | Status: DC | PRN

## 2020-05-06 MED ORDER — METHYLPREDNISOLONE SODIUM SUCC 125 MG IJ SOLR
60.0000 mg | Freq: Two times a day (BID) | INTRAMUSCULAR | Status: DC
  Administered 2020-05-06: 60 mg via INTRAVENOUS
  Filled 2020-05-06: qty 2

## 2020-05-06 MED ORDER — SODIUM CHLORIDE 0.9 % IV SOLN
100.0000 mg | Freq: Every day | INTRAVENOUS | Status: AC
  Administered 2020-05-06 – 2020-05-08 (×3): 100 mg via INTRAVENOUS
  Filled 2020-05-06 (×2): qty 20
  Filled 2020-05-06: qty 100

## 2020-05-06 MED ORDER — CITALOPRAM HYDROBROMIDE 20 MG PO TABS: 20.0000 mg | ORAL_TABLET | Freq: Every day | ORAL | Status: DC | PRN

## 2020-05-06 MED ORDER — METOPROLOL SUCCINATE ER 50 MG PO TB24
50.0000 mg | ORAL_TABLET | Freq: Every day | ORAL | Status: DC
  Administered 2020-05-07 – 2020-05-10 (×4): 50 mg via ORAL
  Filled 2020-05-06 (×4): qty 1

## 2020-05-06 MED ORDER — AZELASTINE HCL 0.1 % NA SOLN
1.0000 | Freq: Two times a day (BID) | NASAL | Status: DC
  Administered 2020-05-07 – 2020-05-10 (×6): 1 via NASAL
  Filled 2020-05-06: qty 30

## 2020-05-06 MED ORDER — SODIUM CHLORIDE 0.9 % IV SOLN
1.0000 g | INTRAVENOUS | Status: DC
  Administered 2020-05-06 – 2020-05-09 (×4): 1 g via INTRAVENOUS
  Filled 2020-05-06: qty 10
  Filled 2020-05-06: qty 1
  Filled 2020-05-06 (×3): qty 10

## 2020-05-06 MED ORDER — ONDANSETRON HCL 4 MG/2ML IJ SOLN
4.0000 mg | Freq: Once | INTRAMUSCULAR | Status: AC
  Administered 2020-05-06: 4 mg via INTRAVENOUS
  Filled 2020-05-06: qty 2

## 2020-05-06 MED ORDER — SODIUM CHLORIDE 0.9 % IV SOLN
500.0000 mg | INTRAVENOUS | Status: DC
  Administered 2020-05-06 – 2020-05-09 (×4): 500 mg via INTRAVENOUS
  Filled 2020-05-06 (×5): qty 500

## 2020-05-06 MED ORDER — LORAZEPAM 2 MG/ML IJ SOLN
1.0000 mg | Freq: Once | INTRAMUSCULAR | Status: AC
  Administered 2020-05-06: 1 mg via INTRAVENOUS
  Filled 2020-05-06: qty 1

## 2020-05-06 MED ORDER — METHYLPREDNISOLONE SODIUM SUCC 40 MG IJ SOLR
40.0000 mg | Freq: Two times a day (BID) | INTRAMUSCULAR | Status: DC
  Administered 2020-05-07 – 2020-05-10 (×7): 40 mg via INTRAVENOUS
  Filled 2020-05-06 (×7): qty 1

## 2020-05-06 MED ORDER — ESCITALOPRAM OXALATE 10 MG PO TABS
10.0000 mg | ORAL_TABLET | Freq: Every day | ORAL | Status: DC
  Administered 2020-05-06 – 2020-05-10 (×5): 10 mg via ORAL
  Filled 2020-05-06 (×5): qty 1

## 2020-05-06 MED ORDER — ASCORBIC ACID 500 MG PO TABS
500.0000 mg | ORAL_TABLET | Freq: Every day | ORAL | Status: DC
  Administered 2020-05-06 – 2020-05-10 (×5): 500 mg via ORAL
  Filled 2020-05-06 (×5): qty 1

## 2020-05-06 MED ORDER — THIAMINE HCL 100 MG PO TABS
100.0000 mg | ORAL_TABLET | Freq: Every day | ORAL | Status: DC
  Administered 2020-05-06 – 2020-05-10 (×5): 100 mg via ORAL
  Filled 2020-05-06 (×5): qty 1

## 2020-05-06 MED ORDER — INSULIN ASPART 100 UNIT/ML ~~LOC~~ SOLN
6.0000 [IU] | Freq: Once | SUBCUTANEOUS | Status: AC
  Administered 2020-05-06: 6 [IU] via INTRAVENOUS
  Filled 2020-05-06: qty 1

## 2020-05-06 MED ORDER — ENOXAPARIN SODIUM 40 MG/0.4ML ~~LOC~~ SOLN
40.0000 mg | Freq: Two times a day (BID) | SUBCUTANEOUS | Status: DC
  Administered 2020-05-06 – 2020-05-08 (×5): 40 mg via SUBCUTANEOUS
  Filled 2020-05-06 (×8): qty 0.4

## 2020-05-06 MED ORDER — IPRATROPIUM BROMIDE HFA 17 MCG/ACT IN AERS
2.0000 | INHALATION_SPRAY | RESPIRATORY_TRACT | Status: DC
  Administered 2020-05-06 – 2020-05-10 (×20): 2 via RESPIRATORY_TRACT
  Filled 2020-05-06 (×2): qty 12.9

## 2020-05-06 MED ORDER — HYDRALAZINE HCL 20 MG/ML IJ SOLN: 5.0000 mg | INTRAMUSCULAR | Status: DC | PRN

## 2020-05-06 MED ORDER — LOSARTAN POTASSIUM-HCTZ 100-12.5 MG PO TABS: 1.0000 | ORAL_TABLET | Freq: Every day | ORAL | Status: DC

## 2020-05-06 MED ORDER — ENOXAPARIN SODIUM 40 MG/0.4ML ~~LOC~~ SOLN: 40.0000 mg | SUBCUTANEOUS | Status: DC

## 2020-05-06 MED ORDER — LOSARTAN POTASSIUM 50 MG PO TABS: 100.0000 mg | ORAL_TABLET | Freq: Every day | ORAL | Status: DC

## 2020-05-06 MED ORDER — ZINC SULFATE 220 (50 ZN) MG PO CAPS
220.0000 mg | ORAL_CAPSULE | Freq: Every day | ORAL | Status: DC
  Administered 2020-05-06 – 2020-05-10 (×5): 220 mg via ORAL
  Filled 2020-05-06 (×5): qty 1

## 2020-05-06 MED ORDER — INSULIN DETEMIR 100 UNIT/ML ~~LOC~~ SOLN
18.0000 [IU] | Freq: Two times a day (BID) | SUBCUTANEOUS | Status: DC
  Administered 2020-05-06 – 2020-05-07 (×2): 18 [IU] via SUBCUTANEOUS
  Filled 2020-05-06 (×3): qty 0.18

## 2020-05-06 MED ORDER — INSULIN ASPART 100 UNIT/ML ~~LOC~~ SOLN
0.0000 [IU] | SUBCUTANEOUS | Status: DC
  Administered 2020-05-06: 20 [IU] via SUBCUTANEOUS
  Administered 2020-05-06: 15 [IU] via SUBCUTANEOUS
  Administered 2020-05-07 (×2): 11 [IU] via SUBCUTANEOUS
  Administered 2020-05-07 (×2): 15 [IU] via SUBCUTANEOUS
  Administered 2020-05-07: 11 [IU] via SUBCUTANEOUS
  Administered 2020-05-08: 7 [IU] via SUBCUTANEOUS
  Administered 2020-05-08: 15 [IU] via SUBCUTANEOUS
  Administered 2020-05-08 (×3): 7 [IU] via SUBCUTANEOUS
  Administered 2020-05-08: 3 [IU] via SUBCUTANEOUS
  Administered 2020-05-09 (×2): 11 [IU] via SUBCUTANEOUS
  Administered 2020-05-09: 4 [IU] via SUBCUTANEOUS
  Administered 2020-05-10 (×2): 11 [IU] via SUBCUTANEOUS
  Filled 2020-05-06 (×18): qty 1

## 2020-05-06 MED ORDER — ONDANSETRON HCL 4 MG/2ML IJ SOLN: 4.0000 mg | Freq: Three times a day (TID) | INTRAMUSCULAR | Status: DC | PRN

## 2020-05-06 MED ORDER — ALBUTEROL SULFATE HFA 108 (90 BASE) MCG/ACT IN AERS
2.0000 | INHALATION_SPRAY | RESPIRATORY_TRACT | Status: DC | PRN
  Filled 2020-05-06: qty 6.7

## 2020-05-06 NOTE — ED Triage Notes (Signed)
Pt returns to the ER was diagnosed with covid on Monday, here for sob, pt was admitted last pm and waiting for a room and got tired of waiting, pt reports that he is very sob, pt is not labored to breathe, resp rate 20, O2 sat of 76% on room air, pt requiring 6L of O2 to bring O2 sats up to 90%

## 2020-05-06 NOTE — ED Notes (Signed)
First Nurse Note: Pt to ED stating that he was here yesterday and admitted but left because of wait for inpatient room. Pt states that he was being admitted for COVID complications

## 2020-05-06 NOTE — ED Notes (Signed)
RT called for HFNC

## 2020-05-06 NOTE — Consult Note (Signed)
Reason for Consult: Acute respiratory failure with hypoxia due to COVID-19 pneumonia Referring Physician: Lorretta Harp, MD  Walter [name] is an 54 y.o. male.  HPI: 54 year old very remote former smoker with a past medical history as noted below, who presented to [facility] ED with complaints of worsening shortness of breath in the setting of COVID-19 pneumonia.  Patient had been admitted yesterday for management of COVID-19 pneumonia however he left AMA.  He returned today because of increasing shortness of breath.  He has had no chest pain.  He has had a dry nonproductive cough.  He has had no hemoptysis.  He is currently requiring high flow nasal cannula at a 55 L flow at 100%.  Saturating between 89 to 92%.  Does not appear distressed.  Not overly tachypneic.  He does not endorse any lower extremity edema.  He has had fevers and chills.  No nausea or vomiting.  Past Medical History:  Diagnosis Date  . Anxiety   . Diabetes mellitus without complication (HCC)   . Hypertension     Past Surgical History:  Procedure Laterality Date  . CHOLECYSTECTOMY      No family history on file.  Social History:  reports that he has quit smoking. He has never used smokeless tobacco. He reports that he does not drink alcohol and does not use drugs.  Allergies: No Known Allergies  Medications:  Scheduled Meds: . vitamin C  500 mg Oral Daily  . baricitinib  4 mg Oral Daily  . insulin aspart  0-5 Units Subcutaneous QHS  . insulin aspart  0-9 Units Subcutaneous TID WC  . ipratropium  2 puff Inhalation Q4H  . methylPREDNISolone (SOLU-MEDROL) injection  60 mg Intravenous Q12H  . thiamine  100 mg Oral Daily  . zinc sulfate  220 mg Oral Daily   Continuous Infusions: . remdesivir 100 mg in NS 100 mL     PRN Meds:.acetaminophen, albuterol, dextromethorphan-guaiFENesin, hydrALAZINE, ondansetron (ZOFRAN) IV   Results for orders placed or performed during the hospital encounter of 05/06/20 (from the past 48  hour(s))  Troponin I (High Sensitivity)     Status: None   Collection Time: 05/06/20 12:54 PM  Result Value Ref Range   Troponin I (High Sensitivity) 10 <18 ng/L    Comment: (NOTE) Elevated high sensitivity troponin I (hsTnI) values and significant  changes across serial measurements may suggest ACS but many other  chronic and acute conditions are known to elevate hsTnI results.  Refer to the "Links" section for chest pain algorithms and additional  guidance. Performed at [facility], [address] [place], [place] [address]   CBC     Status: Abnormal   Collection Time: 05/06/20 12:54 PM  Result Value Ref Range   WBC 13.3 (H) 4.0 - 10.5 K/uL   RBC 5.07 4.22 - 5.81 MIL/uL   Hemoglobin 14.6 13.0 - 17.0 g/dL   HCT 24.4 39 - 52 %   MCV 82.2 80.0 - 100.0 fL   MCH 28.8 26.0 - 34.0 pg   MCHC 35.0 30.0 - 36.0 g/dL   RDW 01.0 27.2 - 53.6 %   Platelets 270 150 - 400 K/uL   nRBC 0.0 0.0 - 0.2 %    Comment: Performed at [facility], [address], [place], [place] [address]  Comprehensive metabolic panel     Status: Abnormal   Collection Time: 05/06/20 12:54 PM  Result Value Ref Range   Sodium 134 (L) 135 - 145 mmol/L  Potassium 4.3 3.5 - 5.1 mmol/L   Chloride 98 98 - 111 mmol/L   CO2 23 22 - 32 mmol/L   Glucose, Bld 457 (H) 70 - 99 mg/dL    Comment: Glucose reference range applies only to samples taken after fasting for at least 8 hours.   BUN 35 (H) 6 - 20 mg/dL   Creatinine, Ser 1.61 0.61 - 1.24 mg/dL   Calcium 8.0 (L) 8.9 - 10.3 mg/dL   Total Protein 7.2 6.5 - 8.1 g/dL   Albumin 3.0 (L) 3.5 - 5.0 g/dL   AST 51 (H) 15 - 41 U/L   ALT 30 0 - 44 U/L   Alkaline Phosphatase 54 38 - 126 U/L   Total Bilirubin 1.0 0.3 - 1.2 mg/dL   GFR calc non Af Amer >60 >60 mL/min   GFR calc Af Amer >60 >60 mL/min   Anion gap 13 5 - 15    Comment: Performed at [facility], [address], [place], [place] [address]  Fibrin derivatives D-Dimer [facility] only)      Status: Abnormal   Collection Time: 05/06/20 12:54 PM  Result Value Ref Range   Fibrin derivatives D-dimer ([facility]) 1,269.08 (H) 0.00 - 499.00 ng/mL (FEU)    Comment: (NOTE) <> Exclusion of Venous Thromboembolism (VTE) - OUTPATIENT ONLY   (Emergency Department or [place])    0-499 ng/ml (FEU): With a low to intermediate pretest probability                      for VTE this test result excludes the diagnosis                      of VTE.   >499 ng/ml (FEU) : VTE not excluded; additional work up for VTE is                      required.  <> Testing on Inpatients and Evaluation of Disseminated Intravascular   Coagulation (DIC) Reference Range:   0-499 ng/ml (FEU) Performed at [facility], [address] [place], [place] [address]   Lactic acid, plasma     Status: Abnormal   Collection Time: 05/06/20  2:19 PM  Result Value Ref Range   Lactic Acid, Venous 2.1 (HH) 0.5 - 1.9 mmol/L    Comment: CRITICAL RESULT CALLED TO, READ BACK BY AND VERIFIED WITH SAMANTHA HAMILTON AT 1503 05/06/20.PMF Performed at [facility], [address] [place], [place] [address]   Procalcitonin     Status: None   Collection Time: 05/06/20  2:19 PM  Result Value Ref Range   Procalcitonin 0.35 ng/mL    Comment:        Interpretation: PCT (Procalcitonin) <= 0.5 ng/mL: Systemic infection (sepsis) is not likely. Local bacterial infection is possible. (NOTE)       Sepsis PCT Algorithm           Lower Respiratory Tract                                      Infection PCT Algorithm    ----------------------------     ----------------------------         PCT < 0.25 ng/mL                PCT < 0.10 ng/mL  Strongly encourage             Strongly discourage   discontinuation of antibiotics    initiation of antibiotics    ----------------------------     -----------------------------       PCT 0.25 - 0.50 ng/mL            PCT 0.10 - 0.25 ng/mL               OR       >80% decrease in PCT             Discourage initiation of                                            antibiotics      Encourage discontinuation           of antibiotics    ----------------------------     -----------------------------         PCT >= 0.50 ng/mL              PCT 0.26 - 0.50 ng/mL               AND        <80% decrease in PCT             Encourage initiation of                                             antibiotics       Encourage continuation           of antibiotics    ----------------------------     -----------------------------        PCT >= 0.50 ng/mL                  PCT > 0.50 ng/mL               AND         increase in PCT                  Strongly encourage                                      initiation of antibiotics    Strongly encourage escalation           of antibiotics                                     -----------------------------                                           PCT <= 0.25 ng/mL                                                 OR                                        >  80% decrease in PCT                                      Discontinue / Do not initiate                                             antibiotics  Performed at [facility], [address] [place], [place] [address]   Lactate dehydrogenase     Status: Abnormal   Collection Time: 05/06/20  2:19 PM  Result Value Ref Range   LDH 341 (H) 98 - 192 U/L    Comment: Performed at [facility], [address] [place], [place] [address]  Ferritin     Status: Abnormal   Collection Time: 05/06/20  2:19 PM  Result Value Ref Range   Ferritin 791 (H) 24 - 336 ng/mL    Comment: Performed at [facility], [address] [place], [place] [address]  Triglycerides     Status: None   Collection Time: 05/06/20  2:19 PM  Result Value Ref Range   Triglycerides 122 <150 mg/dL    Comment: Performed at [facility], [address] [place], [place] [address]  Fibrinogen      Status: Abnormal   Collection Time: 05/06/20  2:19 PM  Result Value Ref Range   Fibrinogen 675 (H) 210 - 475 mg/dL    Comment: Performed at [facility], [address] [place], [place] [address]    DG Chest 1 View  Result Date: 05/04/2020 CLINICAL DATA:  Shortness of breath history of COVID EXAM: CHEST  1 VIEW COMPARISON:  05/01/2015 FINDINGS: Patchy bilateral vague airspace opacities and consolidations. Enlarged cardiomediastinal silhouette with mild central vascular congestion. No pleural effusion or pneumothorax. IMPRESSION: Patchy bilateral airspace opacities and consolidations concerning for bilateral pneumonia. There is cardiomegaly with mild central vascular congestion. Electronically Signed   By: Jasmine PangKim  Fujinaga M.D.   On: 05/04/2020 23:11   DG Chest Portable 1 View  Result Date: 05/06/2020 CLINICAL DATA:  Shortness of breath EXAM: PORTABLE CHEST 1 VIEW COMPARISON:  August 11th 2021 FINDINGS: The cardiomediastinal silhouette is unchanged in contour. No pleural effusion. No pneumothorax. Similar appearance of patchy bilateral heterogeneous opacities peripherally. Peribronchial cuffing and perihilar vascular congestion. Visualized abdomen is unremarkable. No acute osseous abnormality. IMPRESSION: 1. Similar appearance of patchy bilateral heterogeneous opacities peripherally which may reflect the sequela of COVID 19 pneumonia. 2. Peribronchial cuffing and perihilar vascular congestion may reflect an underlying superimposed component of pulmonary edema. Electronically Signed   By: Meda KlinefelterStephanie  Peacock MD   On: 05/06/2020 14:00    Review of Systems  A 10 point review of systems was performed and it is as noted above otherwise negative.  Blood pressure 115/61, pulse 68, temperature 98.2 F (36.8 C), temperature source Oral, resp. rate (!) 22, height 5\' 7"  (1.702 m), weight 117 kg, SpO2 92 %. Physical Exam physical examination is limited due to need for PPE/CAPR GENERAL: Patient is an  obese gentleman in no acute distress.  He is awake alert comfortable on high flow nasal cannula O2. HEAD: Normocephalic, atraumatic.  EYES: Pupils equal, round, reactive to light.  No scleral icterus.  MOUTH: Mallampati class III, oral mucosa moist, no thrush. NECK: Supple. No thyromegaly. Trachea midline. No JVD.  No adenopathy. PULMONARY: No accessory muscle use.  Even respirations.  Cannot assess lung sounds well with the use of CAPR. CARDIOVASCULAR: Monitor shows regular rhythm. GASTROINTESTINAL: Obese abdomen, soft. MUSCULOSKELETAL: No joint deformity, no clubbing, no edema.  NEUROLOGIC: No apparent focal deficits.  Speech is fluent. SKIN: Intact,warm,dry.  Limited exam shows no rashes.   PSYCH: Mood and behavior normal.  Laboratory data has been reviewed by me.  Chest x-ray as noted below, independently reviewed:      Assessment/Plan:  Acute respiratory failure with hypoxia due to COVID-19 pneumonia Continue high flow O2 Maintain oxygen saturations between 88 to 92% Agree with remdesivir and baricitinib Continue vitamin C, zinc, add thiamine Bronchodilators via MDI for mucociliary clearance General supportive care  Diabetes mellitus in poor control On insulin per hospitalist service This will add complexity to his management  Acute kidney injury Avoid nephrotoxins as much as possible Monitor volume status Monitor hemodynamic status  Mild lactic acidosis Monitor lactic acid Volume repletion  Prophylaxis: DVT: Lovenox GI: Antihistamine  C. Danice Goltz, MD [facility] 05/06/2020, 4:12 PM   *This note was dictated using voice recognition software/Dragon.  Despite best efforts to proofread, errors can occur which can change the meaning.  Any change was purely unintentional.

## 2020-05-06 NOTE — ED Provider Notes (Signed)
[facility] Emergency Department Provider Note    None    (approximate)  I have reviewed the triage vital signs and the nursing notes.   HISTORY  Chief Complaint Shortness of Breath    HPI Walter [name] is a 54 y.o. male below listed past medical history presents to the ER for worsening shortness of breath.  Patient was just recently admitted to the hospital for Covid 1968 acute respiratory failure with hypoxia.  He left AMA.  He is back today with worsening fatigue shortness of breath.  Found to be profoundly hypoxic requiring supplemental oxygen.    Past Medical History:  Diagnosis Date  . Anxiety   . Diabetes mellitus without complication (HCC)   . Hypertension    No family history on file. Past Surgical History:  Procedure Laterality Date  . CHOLECYSTECTOMY     Patient Active Problem List   Diagnosis Date Noted  . Acute respiratory failure with hypoxia (HCC) 05/06/2020  . Diabetes mellitus without complication (HCC)   . Hypertension   . Depression   . Pneumonia due to COVID-19 virus 05/05/2020      Prior to Admission medications   Medication Sig Start Date End Date Taking? Authorizing Provider  amLODipine (NORVASC) 5 MG tablet Take 5 mg by mouth daily.   Yes [provider]  Azelastine HCl 137 MCG/SPRAY SOLN Place 1 spray into both nostrils 2 (two) times daily. 05/01/20  Yes [provider]  citalopram (CELEXA) 20 MG tablet Take 20 mg by mouth daily as needed (for anxiety.).   Yes [provider]  escitalopram (LEXAPRO) 10 MG tablet Take 1 tablet (10 mg total) by mouth daily. 05/01/15 05/06/20 Yes Emily Filbert, MD  insulin NPH-regular Human (NOVOLIN 70/30) (70-30) 100 UNIT/ML injection Inject 70-75 Units into the skin 3 (three) times daily.    Yes [provider]  losartan-hydrochlorothiazide (HYZAAR) 100-12.5 MG per tablet Take 1 tablet by mouth daily.   Yes [provider]  metoprolol  succinate (TOPROL-XL) 50 MG 24 hr tablet Take 50 mg by mouth daily. Take with or immediately following a meal.   Yes [provider]    Allergies Patient has no known allergies.    Social History Social History   Tobacco Use  . Smoking status: Former Games developer  . Smokeless tobacco: Never Used  Substance Use Topics  . Alcohol use: No  . Drug use: No    Review of Systems Patient denies headaches, rhinorrhea, blurry vision, numbness, shortness of breath, chest pain, edema, cough, abdominal pain, nausea, vomiting, diarrhea, dysuria, fevers, rashes or hallucinations unless otherwise stated above in HPI. ____________________________________________   PHYSICAL EXAM:  VITAL SIGNS: Vitals:   05/06/20 1350 05/06/20 1401  BP: 115/61   Pulse: 68   Resp: (!) 22   Temp:    SpO2: (!) 85% 92%    Constitutional: Alert and oriented.  Eyes: Conjunctivae are normal.  Head: Atraumatic. Nose: No congestion/rhinnorhea. Mouth/Throat: Mucous membranes are moist.   Neck: No stridor. Painless ROM.  Cardiovascular: Normal rate, regular rhythm. Grossly normal heart sounds.  Good peripheral circulation. Respiratory: tachypnea, diminished bs throughout Gastrointestinal: Soft and nontender. No distention. No abdominal bruits. No CVA tenderness. Genitourinary:  Musculoskeletal: No lower extremity tenderness nor edema.  No joint effusions. Neurologic:  Normal speech and language. No gross focal neurologic deficits are appreciated. No facial droop Skin:  Skin is warm, dry and intact. No rash noted. Psychiatric: Mood and affect are normal. Speech and  behavior are normal.  ____________________________________________   LABS (all labs ordered are listed, but only abnormal results are displayed)  Results for orders placed or performed during the hospital encounter of 05/06/20 (from the past 24 hour(s))  Troponin I (High Sensitivity)     Status: None   Collection Time: 05/06/20 12:54 PM    Result Value Ref Range   Troponin I (High Sensitivity) 10 <18 ng/L  CBC     Status: Abnormal   Collection Time: 05/06/20 12:54 PM  Result Value Ref Range   WBC 13.3 (H) 4.0 - 10.5 K/uL   RBC 5.07 4.22 - 5.81 MIL/uL   Hemoglobin 14.6 13.0 - 17.0 g/dL   HCT 62.8 39 - 52 %   MCV 82.2 80.0 - 100.0 fL   MCH 28.8 26.0 - 34.0 pg   MCHC 35.0 30.0 - 36.0 g/dL   RDW 36.6 29.4 - 76.5 %   Platelets 270 150 - 400 K/uL   nRBC 0.0 0.0 - 0.2 %  Comprehensive metabolic panel     Status: Abnormal   Collection Time: 05/06/20 12:54 PM  Result Value Ref Range   Sodium 134 (L) 135 - 145 mmol/L   Potassium 4.3 3.5 - 5.1 mmol/L   Chloride 98 98 - 111 mmol/L   CO2 23 22 - 32 mmol/L   Glucose, Bld 457 (H) 70 - 99 mg/dL   BUN 35 (H) 6 - 20 mg/dL   Creatinine, Ser 4.65 0.61 - 1.24 mg/dL   Calcium 8.0 (L) 8.9 - 10.3 mg/dL   Total Protein 7.2 6.5 - 8.1 g/dL   Albumin 3.0 (L) 3.5 - 5.0 g/dL   AST 51 (H) 15 - 41 U/L   ALT 30 0 - 44 U/L   Alkaline Phosphatase 54 38 - 126 U/L   Total Bilirubin 1.0 0.3 - 1.2 mg/dL   GFR calc non Af Amer >60 >60 mL/min   GFR calc Af Amer >60 >60 mL/min   Anion gap 13 5 - 15  Fibrin derivatives D-Dimer ([facility] only)     Status: Abnormal   Collection Time: 05/06/20 12:54 PM  Result Value Ref Range   Fibrin derivatives D-dimer ([facility]) 1,269.08 (H) 0.00 - 499.00 ng/mL (FEU)  Fibrinogen     Status: Abnormal   Collection Time: 05/06/20  2:19 PM  Result Value Ref Range   Fibrinogen 675 (H) 210 - 475 mg/dL   ____________________________________________  EKG My review and personal interpretation at Time: 12:44   Indication: sob  Rate: 70  Rhythm: sinus Axis: normal Other: prolonged qt, nonspecific st abn ____________________________________________  RADIOLOGY  I personally reviewed all radiographic images ordered to evaluate for the above acute complaints and reviewed radiology reports and findings.  These findings were personally discussed with the patient.  Please see  medical record for radiology report.  ____________________________________________   PROCEDURES  Procedure(s) performed:  .Critical Care Performed by: Willy Eddy, MD Authorized by: Willy Eddy, MD   Critical care provider statement:    Critical care time (minutes):  45   Critical care was necessary to treat or prevent imminent or life-threatening deterioration of the following conditions:  Respiratory failure   Critical care was time spent personally by me on the following activities:  Discussions with consultants, evaluation of patient's response to treatment, examination of patient, ordering and performing treatments and interventions, ordering and review of laboratory studies, ordering and review of radiographic studies, pulse oximetry, re-evaluation of patient's condition, obtaining history from patient or surrogate and review  of old charts      Critical Care performed: yes ____________________________________________   INITIAL IMPRESSION / ASSESSMENT AND PLAN / ED COURSE  Pertinent labs & imaging results that were available during my care of the patient were reviewed by me and considered in my medical decision making (see chart for details).   DDX: covid, Asthma, copd, CHF, pna, ptx, malignancy, Pe, anemia   DIJUAN SLEETH is a 54 y.o. who presents to the ED with sx as described above and with evidence of acute respiratory failure with hypoxia.  He is currently afebrile.  Will place on HFNC for respiratory support.  Will require admission for medical management. Have discussed with the patient and available family all diagnostics and treatments performed thus far and all questions were answered to the best of my ability. The patient demonstrates understanding and agreement with plan.      The patient was evaluated in Emergency Department today for the symptoms described in the history of present illness. He/she was evaluated in the context of the global COVID-19  pandemic, which necessitated consideration that the patient might be at risk for infection with the SARS-CoV-2 virus that causes COVID-19. Institutional protocols and algorithms that pertain to the evaluation of patients at risk for COVID-19 are in a state of rapid change based on information released by regulatory bodies including the CDC and federal and state organizations. These policies and algorithms were followed during the patient's care in the ED.  As part of my medical decision making, I reviewed the following data within the electronic MEDICAL RECORD NUMBER Nursing notes reviewed and incorporated, Labs reviewed, notes from prior ED visits and [place] Controlled Substance Database   ____________________________________________   FINAL CLINICAL IMPRESSION(S) / ED DIAGNOSES  Final diagnoses:  Acute respiratory failure with hypoxia (HCC)  COVID-19 virus infection      NEW MEDICATIONS STARTED DURING THIS VISIT:  New Prescriptions   No medications on file     Note:  This document was prepared using Dragon voice recognition software and may include unintentional dictation errors.    Willy Eddy, MD 05/06/20 1455

## 2020-05-06 NOTE — Progress Notes (Signed)
PHARMACIST - PHYSICIAN COMMUNICATION  CONCERNING:  Enoxaparin (Lovenox) for DVT Prophylaxis    RECOMMENDATION: Patient was prescribed enoxaprin 40mg  q24 hours for VTE prophylaxis.   Filed Weights   05/06/20 1350  Weight: 117 kg (258 lb)    Body mass index is 40.41 kg/m.  Estimated Creatinine Clearance: 89.1 mL/min (by C-G formula based on SCr of 1.16 mg/dL).   Based on [facility] policy patient is candidate for enoxaparin 40mg  every 12 hour dosing due to BMI being >40.   DESCRIPTION: Pharmacy has adjusted enoxaparin dose per [facility]/[place] policy.  Patient is now receiving enoxaparin 40mg  every 12 hours.   [facility], PharmD, BCPS Clinical Pharmacist 05/06/2020 4:54 PM

## 2020-05-06 NOTE — ED Notes (Signed)
Per RN Anette Riedel he entered room and found that pt had gotten up and unhooked himself from the monitor and had pulled the IV tubing out. Blood present all over the bed, pt and room. RN cleaned up pt and room. Pt redirected and back in bed and placed back on monitor.  Pt is confused but reoriented at this time

## 2020-05-06 NOTE — ED Notes (Signed)
Patient's belongings were in a plastic bag sitting in the sink covered with blood from an earlier incident when the pt removed his IV tubing.  Pt stated the bag had his clothes in it, and he instructed me to "just throw the whole bag away".   The bag was thrown away per his instruction.

## 2020-05-06 NOTE — Progress Notes (Addendum)
Inpatient Diabetes Program Recommendations  AACE/ADA: New Consensus Statement on Inpatient Glycemic Control (2015)  Target Ranges:  Prepandial:   less than 140 mg/dL      Peak postprandial:   less than 180 mg/dL (1-2 hours)      Critically ill patients:  140 - 180 mg/dL   Results for YOEL, KAUFHOLD (MRN [id number]) as of 05/06/2020 16:48  Ref. Range 05/05/2020 09:35 05/05/2020 13:47 05/05/2020 16:43 05/05/2020 22:08  Glucose-Capillary Latest Ref Range: 70 - 99 mg/dL 809 (H) 983 (H) 382 (H) 370 (H)   Results for LINDELL, RENFREW (MRN [id number]) as of 05/06/2020 16:48  Ref. Range 05/06/2020 12:54  Glucose Latest Ref Range: 70 - 99 mg/dL 419 (H)   History: DM  Home DM Meds: 70/30 Insulin 70-75 units TID  Current Orders: Novolog Sensitive Correction Scale/ SSI (0-9 units) TID AC + HS   Pt was here in the ED yesterday.  Left AMA around 11pm last night.  Back to ED today with SOB.  COVID+   MD- Note Glucose 457 at 1pm today.  Patient takes large doses of 70/30 Insulin at home.  70/30 Insulin not ideal for the hospital.  Recommend we convert to Levemir and Novolog while in hospital.  Can resume 70/30 Insulin at time of d/c home.  Please consider:  1. Start Levemir 18 units BID (0.15 units/kg twice daily) per the COVID 19 glycemic control order set  2. Increase there Novolog SSI to the Resistant scale (0-20 units) Q4 hours     --Will follow patient during hospitalization--  Ambrose Finland RN, MSN, CDE Diabetes Coordinator Inpatient Glycemic Control Team Team Pager: [phone] (8a-5p)

## 2020-05-06 NOTE — ED Notes (Signed)
Called Pharmacy and they will send up all medications together shortly

## 2020-05-06 NOTE — Consult Note (Signed)
Remdesivir - Pharmacy Brief Note    A/P:  Patient has already received Remdesivir 200 mg IVPB once, followed by 100 mg IVPB daily x 1  Will finish full regimen with 3 more days of remdesivir 100mg .   , PharmD, BCPS Clinical Pharmacist 05/06/2020 2:08 PM

## 2020-05-06 NOTE — ED Notes (Signed)
Son brings father in and states that his father is Covid positive and just left the hospital AMA and he would like to check him in.  Pt refuses to be seen.  Advised son that if the MD let pt check out AMA and did not commit him we can not make him check into ED against his will.  Advised him that he can take out paperwork to commit his father.  pT is drinking water and pulling mask down, advised pt so please remain masked.  Asked pt if he wanted to be evaluated, pt refused.

## 2020-05-06 NOTE — H&P (Signed)
History and Physical    Walter [name] WUJ:[id number] DOB: 1965/11/23 DOA: 05/06/2020  Referring MD/NP/PA:   PCP: Gaspar Garbeisovec, Richard W, MD   Patient coming from:  The patient is coming from home.  At baseline, pt is independent for most of ADL.        Chief Complaint: SOB  HPI: Walter [name] is a 54 y.o. male with medical history significant of hypertension, diabetes mellitus, depression, anxiety, who presents with shortness of breath.  Patient states that he started having shortness of breath on 8/5, which has been progressively worsening.  Patient has dry cough, fever and chills initially.  His fever and chills have resolved currently.  Denies chest pain.  He has mild diarrhea intermittently, no nausea, vomiting or abdominal pain currently.  No symptoms of UTI.  Patient was tested positive for COVID-19 on 8/8.  He was admitted to hospital on 8/12, but patient left hospital AMA.  Today he comes back because of worsening shortness of breath. The patient is unvaccinated for COVID-19.   Patient has oxygen desaturation to 76% on room air, need high flow nasal cannula oxygen in ED.  ED Course: pt was found to have WBC 13.3, lactic acid 1.6, INR 1.0, troponin 10, D-dime 1269, electrolytes renal function okay, blood pressure 98, blood pressure 115/61, heart rate 68, RR 22.  Chest x-ray with bilateral patchy infiltration.  Patient is admitted to stepdown as inpatient.  Dr. Jayme CloudGonzalez of ICU is consulted.   Review of Systems:   General: no fevers, chills, no body weight gain, has poor appetite, has fatigue HEENT: no blurry vision, hearing changes or sore throat Respiratory: has dyspnea, coughing, no wheezing CV: no chest pain, no palpitations GI: no nausea, vomiting, abdominal pain, has diarrhea, no constipation GU: no dysuria, burning on urination, increased urinary frequency, hematuria  Ext: no leg edema Neuro: no unilateral weakness, numbness, or tingling, no vision change or hearing loss Skin:  no rash, no skin tear. MSK: No muscle spasm, no deformity, no limitation of range of movement in spin Heme: No easy bruising.  Travel history: No recent long distant travel.  Allergy: No Known Allergies  Past Medical History:  Diagnosis Date  . Anxiety   . Diabetes mellitus without complication (HCC)   . Hypertension     Past Surgical History:  Procedure Laterality Date  . CHOLECYSTECTOMY      Social History:  reports that he has quit smoking. He has never used smokeless tobacco. He reports that he does not drink alcohol and does not use drugs.  Family History: His brother had COVID-19 infection  Prior to Admission medications   Medication Sig Start Date End Date Taking? Authorizing Provider  amLODipine (NORVASC) 5 MG tablet Take 5 mg by mouth daily.    [provider]  Azelastine HCl 137 MCG/SPRAY SOLN Place 1 spray into both nostrils 2 (two) times daily. 05/01/20   [provider]  azithromycin (ZITHROMAX) 250 MG tablet Take 250 mg by mouth See admin instructions. Take 2 tablets on day 1, then 1 tablet daily for 4 days. 05/01/20 05/06/20  [provider]  benzonatate (TESSALON) 200 MG capsule Take 200 mg by mouth 3 (three) times daily as needed. 05/01/20   [provider]  chlorpheniramine-HYDROcodone (TUSSIONEX) 10-8 MG/5ML SUER Take 5 mLs by mouth every 12 (twelve) hours as needed for cough. 05/01/20   [provider]  citalopram (CELEXA) 20 MG tablet Take 20 mg by mouth daily as needed (for anxiety.).  [provider]  escitalopram (LEXAPRO) 10 MG tablet Take 1 tablet (10 mg total) by mouth daily. 05/01/15 04/30/16  Emily Filbert, MD  insulin NPH-regular Human (NOVOLIN 70/30) (70-30) 100 UNIT/ML injection Inject 70 Units into the skin 3 (three) times daily.    [provider]  losartan-hydrochlorothiazide (HYZAAR) 100-12.5 MG per tablet Take 1 tablet by mouth daily.    [provider]  metoprolol succinate  (TOPROL-XL) 50 MG 24 hr tablet Take 50 mg by mouth daily. Take with or immediately following a meal.    [provider]    Physical Exam: Vitals:   05/06/20 1350 05/06/20 1401 05/06/20 1609 05/06/20 1703  BP:    113/63  Pulse:   67 69  Resp:    (!) 24  Temp:      TempSrc:      SpO2:  92% 91% 91%  Weight: 117 kg     Height: 5\' 7"  (1.702 m)      General: Not in acute distress HEENT:       Eyes: PERRL, EOMI, no scleral icterus.       ENT: No discharge from the ears and nose, no pharynx injection, no tonsillar enlargement.        Neck: No JVD, no bruit, no mass felt. Heme: No neck lymph node enlargement. Cardiac: S1/S2, RRR, No murmurs, No gallops or rubs. Respiratory: No rales, wheezing, rhonchi or rubs. GI: Soft, nondistended, nontender, no rebound pain, no organomegaly, BS present. GU: No hematuria Ext: No pitting leg edema bilaterally. 2+DP/PT pulse bilaterally. Musculoskeletal: No joint deformities, No joint redness or warmth, no limitation of ROM in spin. Skin: No rashes.  Neuro: Alert, oriented X3, cranial nerves II-XII grossly intact, moves all extremities normally.  Psych: Patient is not psychotic, no suicidal or hemocidal ideation.  Labs on Admission: I have personally reviewed following labs and imaging studies  CBC: Recent Labs  Lab 05/04/20 2313 05/06/20 1254  WBC 7.9 13.3*  HGB 13.7 14.6  HCT 41.0 41.7  MCV 83.8 82.2  PLT 154 270   Basic Metabolic Panel: Recent Labs  Lab 05/04/20 2313 05/05/20 0411 05/05/20 1228 05/06/20 1254  NA 128*  --  133* 134*  K 3.3*  --  3.8 4.3  CL 91*  --  97* 98  CO2 23  --  23 23  GLUCOSE 258*  --  450* 457*  BUN 21*  --  25* 35*  CREATININE 1.30*  --  1.16 1.16  CALCIUM 7.8*  --  7.9* 8.0*  MG  --  2.1  --   --    GFR: Estimated Creatinine Clearance: 89.1 mL/min (by C-G formula based on SCr of 1.16 mg/dL). Liver Function Tests: Recent Labs  Lab 05/05/20 1228 05/06/20 1254  AST 44* 51*  ALT 29 30   ALKPHOS 53 54  BILITOT 1.1 1.0  PROT 7.1 7.2  ALBUMIN 3.0* 3.0*   No results for input(s): LIPASE, AMYLASE in the last 168 hours. No results for input(s): AMMONIA in the last 168 hours. Coagulation Profile: Recent Labs  Lab 05/04/20 2313  INR 1.0   Cardiac Enzymes: No results for input(s): CKTOTAL, CKMB, CKMBINDEX, TROPONINI in the last 168 hours. BNP (last 3 results) No results for input(s): PROBNP in the last 8760 hours. HbA1C: Recent Labs    05/05/20 1229  HGBA1C 9.9*   CBG: Recent Labs  Lab 05/05/20 0935 05/05/20 1347 05/05/20 1643 05/05/20 2208  GLUCAP 393* 437* 472* 370*   Lipid  Profile: Recent Labs    05/04/20 2313 05/06/20 1419  TRIG 79 122   Thyroid Function Tests: No results for input(s): TSH, T4TOTAL, FREET4, T3FREE, THYROIDAB in the last 72 hours. Anemia Panel: Recent Labs    05/05/20 0411 05/06/20 1419  FERRITIN 580* 791*   Urine analysis: No results found for: COLORURINE, APPEARANCEUR, LABSPEC, PHURINE, GLUCOSEU, HGBUR, BILIRUBINUR, KETONESUR, PROTEINUR, UROBILINOGEN, NITRITE, LEUKOCYTESUR Sepsis Labs: @LABRCNTIP (procalcitonin:4,lacticidven:4) ) Recent Results (from the past 240 hour(s))  Blood Culture (routine x 2)     Status: None (Preliminary result)   Collection Time: 05/04/20 11:13 PM   Specimen: BLOOD  Result Value Ref Range Status   Specimen Description BLOOD RFOA  Final   Special Requests BOTTLES DRAWN AEROBIC AND ANAEROBIC BCAV  Final   Culture   Final    NO GROWTH 1 DAY Performed at [facility], [address], [place], [place]    Report Status PENDING  Incomplete  Blood Culture (routine x 2)     Status: None (Preliminary result)   Collection Time: 05/04/20 11:13 PM   Specimen: BLOOD  Result Value Ref Range Status   Specimen Description BLOOD LAC  Final   Special Requests BOTTLES DRAWN AEROBIC AND ANAEROBIC BCAV  Final   Culture   Final    NO GROWTH 2 DAYS Performed at [facility], [address] [place], [place]    Report Status PENDING  Incomplete     Radiological Exams on Admission: DG Chest 1 View  Result Date: 05/04/2020 CLINICAL DATA:  Shortness of breath history of COVID EXAM: CHEST  1 VIEW COMPARISON:  05/01/2015 FINDINGS: Patchy bilateral vague airspace opacities and consolidations. Enlarged cardiomediastinal silhouette with mild central vascular congestion. No pleural effusion or pneumothorax. IMPRESSION: Patchy bilateral airspace opacities and consolidations concerning for bilateral pneumonia. There is cardiomegaly with mild central vascular congestion. Electronically Signed   By: 07/01/2015 M.D.   On: 05/04/2020 23:11   DG Chest Portable 1 View  Result Date: 05/06/2020 CLINICAL DATA:  Shortness of breath EXAM: PORTABLE CHEST 1 VIEW COMPARISON:  August 11th 2021 FINDINGS: The cardiomediastinal silhouette is unchanged in contour. No pleural effusion. No pneumothorax. Similar appearance of patchy bilateral heterogeneous opacities peripherally. Peribronchial cuffing and perihilar vascular congestion. Visualized abdomen is unremarkable. No acute osseous abnormality. IMPRESSION: 1. Similar appearance of patchy bilateral heterogeneous opacities peripherally which may reflect the sequela of COVID 19 pneumonia. 2. Peribronchial cuffing and perihilar vascular congestion may reflect an underlying superimposed component of pulmonary edema. Electronically Signed   By: 12-06-1995 MD   On: 05/06/2020 14:00     EKG: Independently reviewed.  Sinus rhythm, QTC 503, nonspecific to change  Assessment/Plan Principal Problem:   Pneumonia due to COVID-19 virus Active Problems:   Acute respiratory failure with hypoxia (HCC)   Diabetes mellitus without complication (HCC)   Hypertension   Depression   Acute respiratory failure with hypoxia 2/2 Pneumonia due to COVID-19 virus: pt needs HFNC oxygen.  Chest x-ray showed bilateral patchy infiltration.  No chest  pain.  Patient has a mild leukocytosis, cannot completely rule out CAP.  Will start antibiotics empirically  -will admit to SDU as inpt -Remdesivir per pharm -start Baricitinib -Rocephin and azithromycin -Solumedrol 60 mg bid -vitamin C, zinc.  -Bronchodilators -PRN Mucinex for cough -f/u Blood culture -D-dimer, BNP,Trop, LFT, CRP, LDH, Procalcitonin, Ferritin, fibinogen, TG, Hep B Lexapro HIV ab -Daily CRP, Ferritin, D-dimer, -Will ask the patient to maintain an awake prone position for  16+ hours a day, if possible, with a minimum of 2-3 hours at a time -Will attempt to maintain euvolemia to a net negative fluid status -Dr. Jayme Cloud of critical care is consulted.  Diabetes mellitus without complication [facility]): Recent A1c 9.9.  Poorly controlled.  Patient is taking 70/30 insulin 70 units 3 times daily. -Switch to Levemir 18 units twice daily per diabetic educator recommendation -Sliding scale insulin  Hypertension -IV hydralazine as needed -Continue amlodipine, Hyzaar, metoprolol  Depression -Continue Celexa and Lexapro    DVT ppx: SQ Lovenox Code Status: Full code Family Communication: not done, no family member is at bed side.      Disposition Plan:  Anticipate discharge back to previous environment Consults called:  Dr. Jayme Cloud of intensive care Admission status:  SDU/inpation        Status is: Inpatient  Remains inpatient appropriate because:Inpatient level of care appropriate due to severity of illness   Dispo: The patient is from: Home              Anticipated d/c is to: Home              Anticipated d/c date is: 2-3 days              Patient currently is not medically stable to d/c.           Date of Service 05/06/2020    Lorretta Harp [facility]   If 7PM-7AM, please contact night-coverage [email or web address] 05/06/2020, 5:11 PM

## 2020-05-06 NOTE — ED Notes (Signed)
Hospitalist Dr. Clyde Lundborg notified CBG 427; he instructed administration of 20 units of novolog at this time (sliding scale goes only to 400).

## 2020-05-07 DIAGNOSIS — U071 COVID-19: Secondary | ICD-10-CM

## 2020-05-07 DIAGNOSIS — J9601 Acute respiratory failure with hypoxia: Secondary | ICD-10-CM

## 2020-05-07 DIAGNOSIS — J1282 Pneumonia due to Coronavirus disease 2019: Secondary | ICD-10-CM

## 2020-05-07 DIAGNOSIS — E119 Type 2 diabetes mellitus without complications: Secondary | ICD-10-CM

## 2020-05-07 LAB — GLUCOSE, CAPILLARY
Glucose-Capillary: 261 mg/dL — ABNORMAL HIGH (ref 70–99)
Glucose-Capillary: 262 mg/dL — ABNORMAL HIGH (ref 70–99)
Glucose-Capillary: 278 mg/dL — ABNORMAL HIGH (ref 70–99)
Glucose-Capillary: 289 mg/dL — ABNORMAL HIGH (ref 70–99)
Glucose-Capillary: 313 mg/dL — ABNORMAL HIGH (ref 70–99)
Glucose-Capillary: 330 mg/dL — ABNORMAL HIGH (ref 70–99)

## 2020-05-07 LAB — CBC WITH DIFFERENTIAL/PLATELET
Abs Immature Granulocytes: 0.09 10*3/uL — ABNORMAL HIGH (ref 0.00–0.07)
Basophils Absolute: 0 10*3/uL (ref 0.0–0.1)
Basophils Relative: 0 %
Eosinophils Absolute: 0 10*3/uL (ref 0.0–0.5)
Eosinophils Relative: 0 %
HCT: 41.2 % (ref 39.0–52.0)
Hemoglobin: 14 g/dL (ref 13.0–17.0)
Immature Granulocytes: 1 %
Lymphocytes Relative: 13 %
Lymphs Abs: 0.9 10*3/uL (ref 0.7–4.0)
MCH: 28.7 pg (ref 26.0–34.0)
MCHC: 34 g/dL (ref 30.0–36.0)
MCV: 84.4 fL (ref 80.0–100.0)
Monocytes Absolute: 0.5 10*3/uL (ref 0.1–1.0)
Monocytes Relative: 7 %
Neutro Abs: 5.7 10*3/uL (ref 1.7–7.7)
Neutrophils Relative %: 79 %
Platelets: 270 10*3/uL (ref 150–400)
RBC: 4.88 MIL/uL (ref 4.22–5.81)
RDW: 13.8 % (ref 11.5–15.5)
WBC: 7.2 10*3/uL (ref 4.0–10.5)
nRBC: 0.3 % — ABNORMAL HIGH (ref 0.0–0.2)

## 2020-05-07 LAB — HEPATITIS B SURFACE ANTIGEN: Hepatitis B Surface Ag: NONREACTIVE

## 2020-05-07 LAB — BRAIN NATRIURETIC PEPTIDE: B Natriuretic Peptide: 54.8 pg/mL (ref 0.0–100.0)

## 2020-05-07 LAB — LACTIC ACID, PLASMA: Lactic Acid, Venous: 1.6 mmol/L (ref 0.5–1.9)

## 2020-05-07 LAB — FIBRIN DERIVATIVES D-DIMER (ARMC ONLY): Fibrin derivatives D-dimer (ARMC): 984.77 ng/mL (FEU) — ABNORMAL HIGH (ref 0.00–499.00)

## 2020-05-07 LAB — FERRITIN: Ferritin: 713 ng/mL — ABNORMAL HIGH (ref 24–336)

## 2020-05-07 LAB — C-REACTIVE PROTEIN: CRP: 2.9 mg/dL — ABNORMAL HIGH (ref <1.0)

## 2020-05-07 MED ORDER — LOSARTAN POTASSIUM 50 MG PO TABS
100.0000 mg | ORAL_TABLET | Freq: Every day | ORAL | Status: DC
  Administered 2020-05-07 – 2020-05-10 (×4): 100 mg via ORAL
  Filled 2020-05-07 (×4): qty 2

## 2020-05-07 MED ORDER — INSULIN ASPART 100 UNIT/ML ~~LOC~~ SOLN
5.0000 [IU] | Freq: Three times a day (TID) | SUBCUTANEOUS | Status: DC
  Administered 2020-05-07 – 2020-05-10 (×8): 5 [IU] via SUBCUTANEOUS
  Filled 2020-05-07 (×7): qty 1

## 2020-05-07 MED ORDER — DEXMEDETOMIDINE HCL IN NACL 400 MCG/100ML IV SOLN: 0.4000 ug/kg/h | INTRAVENOUS | Status: DC

## 2020-05-07 MED ORDER — INSULIN DETEMIR 100 UNIT/ML ~~LOC~~ SOLN
20.0000 [IU] | Freq: Two times a day (BID) | SUBCUTANEOUS | Status: DC
  Administered 2020-05-07: 20 [IU] via SUBCUTANEOUS
  Filled 2020-05-07 (×3): qty 0.2

## 2020-05-07 MED ORDER — DEXMEDETOMIDINE BOLUS VIA INFUSION
1.0000 ug/kg | Freq: Once | INTRAVENOUS | Status: DC
  Filled 2020-05-07: qty 117

## 2020-05-07 MED ORDER — HYDROCHLOROTHIAZIDE 12.5 MG PO CAPS
12.5000 mg | ORAL_CAPSULE | Freq: Every day | ORAL | Status: DC
  Administered 2020-05-07 – 2020-05-10 (×4): 12.5 mg via ORAL
  Filled 2020-05-07 (×4): qty 1

## 2020-05-07 NOTE — ED Notes (Signed)
Pt given breakfast tray

## 2020-05-07 NOTE — ED Notes (Signed)
Pt asleep with HFNC off- O2 replaced

## 2020-05-07 NOTE — ED Notes (Signed)
Pt ambulated to hall at this time, pt had disconnected from his monitor and o2. Pt returned to bed and spo2 read 79%. Placed back on high flow [place]. PT A&O x4. Pt has removed his IV, no bleeding noted.

## 2020-05-07 NOTE — ED Notes (Signed)
Patient removed pulse oximetry sensor. RN to beside. RN placed new pulse oximetry sensor on patient. Patient reminded that all sensors/monitors need to remain on patient. Patient verbalized understanding.

## 2020-05-07 NOTE — ED Notes (Signed)
RN at bedside. Pt had removed oxygen via [place]. Oxygen placed back on pt. Pt educated again about dangers of removing oxygen. Pt verbalized understanding.   

## 2020-05-07 NOTE — ED Notes (Signed)
Lab called to get lactic

## 2020-05-07 NOTE — ED Notes (Signed)
Pt standing at window in the door- pt has removed oxygen and monitoring equipment again- replaced all missing equipment and explained to pt importance of keeping it on- pt verbalized understanding

## 2020-05-07 NOTE — ED Notes (Signed)
Dr Nelson Chimes at beside and has to repeatedly tell pt to place oxygen back on as pt had taken off O2 and O2 monitor

## 2020-05-07 NOTE — ED Notes (Signed)
RT at bedside.

## 2020-05-07 NOTE — ED Notes (Signed)
This RN to room because pt had taken HFNC off again- HFNC replaced and attempted to get lactic x2 with no success

## 2020-05-07 NOTE — ED Notes (Addendum)
This RN to room to give medications and lunch tray and pt had all equipment off- pt states he does not know how to put O2 back on- HFNC was broken into multiple pieces- called respiratory to get another one- pt placed on 15L NRB while waiting for respiratory

## 2020-05-07 NOTE — ED Notes (Signed)
RN at bedside. Pt had removed oxygen via [place]. Oxygen placed back on pt. Pt educated again about dangers of removing oxygen. Pt verbalized understanding.   

## 2020-05-07 NOTE — ED Notes (Signed)
Pt had again taken off all monitoring equipment off and was trying to replace HFNC- assisted pt to replace all equipment

## 2020-05-07 NOTE — ED Notes (Signed)
Pt oxygen saturation at 83 percent. RN at bedside. Pt had removed oxygen via . Oxygen placed back on pt. Pt educated again about dangers of removing oxygen. Pt verbalized understanding.

## 2020-05-07 NOTE — Progress Notes (Signed)
Follow up - Critical Care Medicine Note  Patient Details:    Walter [name] is an 54 y.o. male who was admitted 06 May 2020 with pneumonia due to COVID-19 and acute respiratory failure with hypoxia secondary to the same.  Lines, Airways, Drains: PIV only  Anti-infectives:  Anti-infectives (From admission, onward)   Start     Dose/Rate Route Frequency Ordered Stop   05/06/20 2000  cefTRIAXone (ROCEPHIN) 1 g in sodium chloride 0.9 % 100 mL IVPB     Discontinue     1 g 200 mL/hr over 30 Minutes Intravenous Every 24 hours 05/06/20 1809     05/06/20 2000  azithromycin (ZITHROMAX) 500 mg in sodium chloride 0.9 % 250 mL IVPB     Discontinue     500 mg 250 mL/hr over 60 Minutes Intravenous Every 24 hours 05/06/20 1809     05/06/20 1500  remdesivir 100 mg in sodium chloride 0.9 % 100 mL IVPB     Discontinue     100 mg 200 mL/hr over 30 Minutes Intravenous Daily 05/06/20 1406 05/09/20 0959     Scheduled Meds:  amLODipine  5 mg Oral Daily   vitamin C  500 mg Oral Daily   azelastine  1 spray Each Nare BID   baricitinib  4 mg Oral Daily   enoxaparin (LOVENOX) injection  40 mg Subcutaneous Q12H   escitalopram  10 mg Oral Daily   losartan  100 mg Oral Daily   And   hydrochlorothiazide  12.5 mg Oral Daily   insulin aspart  0-20 Units Subcutaneous Q4H   insulin detemir  18 Units Subcutaneous BID   ipratropium  2 puff Inhalation Q4H   methylPREDNISolone (SOLU-MEDROL) injection  40 mg Intravenous Q12H   metoprolol succinate  50 mg Oral Daily   thiamine  100 mg Oral Daily   zinc sulfate  220 mg Oral Daily   Continuous Infusions:  azithromycin Stopped (05/06/20 2325)   cefTRIAXone (ROCEPHIN)  IV Stopped (05/07/20 0015)   famotidine (PEPCID) IV Stopped (05/07/20 1208)   remdesivir 100 mg in NS 100 mL Stopped (05/07/20 1126)   PRN Meds:.acetaminophen, albuterol, citalopram, hydrALAZINE, hydrOXYzine   Microbiology: Results for orders placed or performed during the  hospital encounter of 05/06/20  Blood Culture (routine x 2)     Status: None (Preliminary result)   Collection Time: 05/06/20  2:19 PM   Specimen: BLOOD  Result Value Ref Range Status   Specimen Description BLOOD LEFT ANTECUBITAL  Final   Special Requests   Final    BOTTLES DRAWN AEROBIC AND ANAEROBIC Blood Culture results may not be optimal due to an inadequate volume of blood received in culture bottles   Culture   Final    NO GROWTH < 24 HOURS Performed at [facility], [address] [place], [place] [address]    Report Status PENDING  Incomplete  Blood Culture (routine x 2)     Status: None (Preliminary result)   Collection Time: 05/06/20  2:20 PM   Specimen: BLOOD  Result Value Ref Range Status   Specimen Description BLOOD RIGHT ANTECUBITAL  Final   Special Requests   Final    BOTTLES DRAWN AEROBIC AND ANAEROBIC Blood Culture adequate volume   Culture   Final    NO GROWTH < 24 HOURS Performed at [facility], [address] [place], [place] [address]    Report Status PENDING  Incomplete    Best Practice/Protocols:  VTE Prophylaxis: Lovenox (Prophylactic dose  adjusted to weight and GFR) GI Prophylaxis: Antihistamine   Events: 05/05/2020: Presented to ED with hypoxia and diagnosed with COVID-19 pneumonia, left AMA 05/06/2020: Returned to ED with increasing shortness of breath, admitted for management of COVID-19 pneumonia and acute hypoxic respiratory failure requiring high flow O2 05/07/2020: No new events, uneventful night.  High flow O2 being titrated   Studies: DG Chest 1 View  Result Date: 05/04/2020 CLINICAL DATA:  Shortness of breath history of COVID EXAM: CHEST  1 VIEW COMPARISON:  05/01/2015 FINDINGS: Patchy bilateral vague airspace opacities and consolidations. Enlarged cardiomediastinal silhouette with mild central vascular congestion. No pleural effusion or pneumothorax. IMPRESSION: Patchy bilateral airspace opacities and consolidations  concerning for bilateral pneumonia. There is cardiomegaly with mild central vascular congestion. Electronically Signed   By: Jasmine Pang M.D.   On: 05/04/2020 23:11   DG Chest Portable 1 View  Result Date: 05/06/2020 CLINICAL DATA:  Shortness of breath EXAM: PORTABLE CHEST 1 VIEW COMPARISON:  August 11th 2021 FINDINGS: The cardiomediastinal silhouette is unchanged in contour. No pleural effusion. No pneumothorax. Similar appearance of patchy bilateral heterogeneous opacities peripherally. Peribronchial cuffing and perihilar vascular congestion. Visualized abdomen is unremarkable. No acute osseous abnormality. IMPRESSION: 1. Similar appearance of patchy bilateral heterogeneous opacities peripherally which may reflect the sequela of COVID 19 pneumonia. 2. Peribronchial cuffing and perihilar vascular congestion may reflect an underlying superimposed component of pulmonary edema. Electronically Signed   By: Meda Klinefelter MD   On: 05/06/2020 14:00    Consults:  PCCM  Subjective:    Overnight Issues: No overnight issues, feels somewhat better.  Weaning oxygen off.  He is down to 50% FiO2 at 40 L flow.  No other concerns or complaints voiced.  Objective:  Vital signs for last 24 hours: Pulse Rate:  [64-87] 66 (08/14 1210) Resp:  [8-26] 18 (08/14 1132) BP: (95-151)/(52-91) 128/69 (08/14 1208) SpO2:  [76 %-97 %] 90 % (08/14 1210) FiO2 (%):  [56 %-99 %] 56 % (08/14 1044) Weight:  [166 kg] 117 kg (08/13 1350)  Hemodynamic parameters for last 24 hours:    Intake/Output from previous day: No intake/output data recorded.  Intake/Output this shift: Total I/O In: 150 [IV Piggyback:150] Out: -   Vent settings for last 24 hours: FiO2 (%):  [56 %-99 %] 56 %  Physical Exam:   physical examination is limited due to need for PPE/CAPR GENERAL: Patient is an obese gentleman in no acute distress.  He is awake alert comfortable on high flow nasal cannula O2. HEAD: Normocephalic,  atraumatic.  EYES: Pupils equal, round, reactive to light.  No scleral icterus.  MOUTH: Mallampati class III, oral mucosa moist, no thrush. NECK: Supple. No thyromegaly. Trachea midline. No JVD.  No adenopathy. PULMONARY: No accessory muscle use.  Even respirations.  Air entry good bilaterally, cannot assess further. CARDIOVASCULAR: Monitor shows regular rhythm. GASTROINTESTINAL: Obese abdomen, soft. MUSCULOSKELETAL: No joint deformity, no clubbing, no edema.  NEUROLOGIC: No apparent focal deficits.  Speech is fluent. SKIN: Intact,warm,dry.  Limited exam shows no rashes.   PSYCH: Mood and behavior normal.  Assessment/Plan:  Acute respiratory failure with hypoxia due to COVID-19 pneumonia Continue high flow O2, weaning, FiO2 requirements down Maintain oxygen saturations between 88 to 92% Continue remdesivir and baricitinib Continue vitamin C, zinc, add thiamine Bronchodilators via MDI for mucociliary clearance Continue general supportive care  Diabetes mellitus in poor control On insulin per hospitalist service This issue adds complexity to his management  Acute kidney injury Avoid nephrotoxins as much as  possible Monitor volume status Monitor hemodynamic status  Mild lactic acidosis Monitor lactic acid Volume repletion    LOS: 1 day   Additional comments:   Critical Care Total Time*: Level 3 follow-up  C. Danice Goltz, MD [facility] 05/07/2020  *This note was dictated using voice recognition software/Dragon.  Despite best efforts to proofread, errors can occur which can change the meaning.  Any change was purely unintentional.

## 2020-05-07 NOTE — Progress Notes (Signed)
PROGRESS NOTE    Ihor AustinBrian K [name]  ZOX:[id number] DOB: 01-27-1966 DOA: 05/06/2020 PCP: Gaspar Garbeisovec, Richard W, MD   Brief Narrative:  Walter GammonBrian [name]  is a 54 y.o. obese Caucasian male with a known history of hypertension and type diabetes mellitus was anxiety who presented to the emergency room with acute onset of shortness of breath since Thursday with associated cough mainly dry and occasionally productive of gray sputum as well as occasional wheezing.  He had fever and chills a couple of days last week.  Noted to diarrhea without nausea or vomiting.  He has been having loss of taste and smell. Patient is unvaccinated and found to be positive for COVID-19 PCR. Chest x-ray with bilateral airspace opacities consistent with COVID-19 pneumonia. Procalcitonin positive at 0.23. He was started on empiric antibiotics along with remdesivir and steroid. Patient left AMA after getting second dose of remdesivir on 8/12 and then came back again on 05/06/2020 with worsening hypoxia and shortness of breath.  Remdesivir was resumed and he was also started on baricitinib. Initially requiring heated HFNC at 50 L with 100% FiO2.  Subjective: Patient was saturating in 70s when I entered the room, sitting in bed and playing with his phone.  Seems confused but able to answer all the orientation questions??  May be low IQ??  Oxygen cannula and pulse ox were on the floor.  Per nursing concern similar behavior overnight.  He was keep pulling his oxygen and pulse ox.  Appears comfortable and speaking in full sentences.  Assessment & Plan:   Principal Problem:   Pneumonia due to COVID-19 virus Active Problems:   Acute respiratory failure with hypoxia (HCC)   Diabetes mellitus without complication (HCC)   Hypertension   Depression  Acute hypoxic respiratory failure secondary to COVID-19 pneumonia. Elevated inflammatory markers, not trending down, procalcitonin positive at 0.23 >>0.35, most likely some superadded bacterial  infection. Appears comfortable and speaking in full sentences.  Initially requiring 50 L at 100% FiO2, able to wean down to 50% with 35 L of oxygen.  Left AMA for 1 day and then came back, no missed doses of remdesivir at this time. -He was started on baricitinib due to worsening hypoxia. -Continue remdesivir--day 4 -Continue Solu Medrol 40 mg twice daily. -Continue to monitor inflammatory markers. -Continue supportive care with supplemental oxygen. -Continue supportive care. -Continue ceftriaxone and Zithromax due to possible superadded bacterial infection.  Electrolyte abnormalities.  Patient had hypokalemia during initial admission, electrolytes within normal limit.  Pseudohyper hyponatremia secondary to hyperglycemia. -Continue to follow.  Type 2 diabetes mellitus.  A1c of 9.9.  Patient uses 70/30 at home. Elevated CBG.  He was started on Levemir and sliding scale during current admission. -Increase Levemir to 20 units twice daily -Add 5 units with meals. -Continue resistant sliding scale.  Hypertension. Blood pressure currently within goal. -Continue home dose of amlodipine, Hyzaar and Toprol XL.  Depression. No acute concern. -Continue home dose of Celexa.  Morbid obesity. Body mass index is 40.41 kg/m.  This can complicate overall prognosis.  Objective: Vitals:   05/07/20 1208 05/07/20 1210 05/07/20 1300 05/07/20 1301  BP: 128/69  117/88   Pulse: 67 66 66   Resp:      Temp:      TempSrc:      SpO2:  90%  94%  Weight:      Height:        Intake/Output Summary (Last 24 hours) at 05/07/2020 1407 Last data filed at 05/07/2020 1208 Gross  per 24 hour  Intake 150 ml  Output --  Net 150 ml   Filed Weights   05/06/20 1350  Weight: 117 kg    Examination:  General.  Well-developed, obese gentleman, in no acute distress. Pulmonary.  Few scattered rhonchi bilaterally, normal respiratory effort. CV.  Regular rate and rhythm, no JVD, rub or murmur. Abdomen.  Soft,  nontender, nondistended, BS positive. CNS.  Alert and oriented x3.  No focal neurologic deficit. Extremities.  No edema, no cyanosis, pulses intact and symmetrical. Psychiatry.  Judgment and insight appears normal.  DVT prophylaxis: Lovenox Code Status: Full Family Communication: Daughter was updated on phone.  According to her he appears confused since Wednesday, yesterday when he came home he was talking completely nonsense. Disposition Plan:  Status is: Inpatient  Remains inpatient appropriate because:Inpatient level of care appropriate due to severity of illness   Dispo: The patient is from: Home              Anticipated d/c is to: Home              Anticipated d/c date is: 2-3 days.              Patient currently is not medically stable to d/c.  Consultants:   None  Procedures:  Antimicrobials:  Zithromax Ceftriaxone.  Data Reviewed: I have personally reviewed following labs and imaging studies  CBC: Recent Labs  Lab 05/04/20 2313 05/06/20 1254 05/07/20 0606  WBC 7.9 13.3* 7.2  NEUTROABS  --   --  5.7  HGB 13.7 14.6 14.0  HCT 41.0 41.7 41.2  MCV 83.8 82.2 84.4  PLT 154 270 270   Basic Metabolic Panel: Recent Labs  Lab 05/04/20 2313 05/05/20 0411 05/05/20 1228 05/06/20 1254  NA 128*  --  133* 134*  K 3.3*  --  3.8 4.3  CL 91*  --  97* 98  CO2 23  --  23 23  GLUCOSE 258*  --  450* 457*  BUN 21*  --  25* 35*  CREATININE 1.30*  --  1.16 1.16  CALCIUM 7.8*  --  7.9* 8.0*  MG  --  2.1  --   --    GFR: Estimated Creatinine Clearance: 89.1 mL/min (by C-G formula based on SCr of 1.16 mg/dL). Liver Function Tests: Recent Labs  Lab 05/05/20 1228 05/06/20 1254  AST 44* 51*  ALT 29 30  ALKPHOS 53 54  BILITOT 1.1 1.0  PROT 7.1 7.2  ALBUMIN 3.0* 3.0*   No results for input(s): LIPASE, AMYLASE in the last 168 hours. No results for input(s): AMMONIA in the last 168 hours. Coagulation Profile: Recent Labs  Lab 05/04/20 2313  INR 1.0   Cardiac  Enzymes: No results for input(s): CKTOTAL, CKMB, CKMBINDEX, TROPONINI in the last 168 hours. BNP (last 3 results) No results for input(s): PROBNP in the last 8760 hours. HbA1C: Recent Labs    05/05/20 1229  HGBA1C 9.9*   CBG: Recent Labs  Lab 05/06/20 2008 05/06/20 2216 05/06/20 2345 05/07/20 0330 05/07/20 0758  GLUCAP 390* 344* 316* 289* 261*   Lipid Profile: Recent Labs    05/04/20 2313 05/06/20 1419  TRIG 79 122   Thyroid Function Tests: No results for input(s): TSH, T4TOTAL, FREET4, T3FREE, THYROIDAB in the last 72 hours. Anemia Panel: Recent Labs    05/06/20 1419 05/07/20 0606  FERRITIN 791* 713*   Sepsis Labs: Recent Labs  Lab 05/04/20 2313 05/05/20 0500 05/06/20 1419 05/06/20 1658  PROCALCITON  0.23 0.23 0.35  --   LATICACIDVEN 1.6  --  2.1* 2.2*    Recent Results (from the past 240 hour(s))  Blood Culture (routine x 2)     Status: None (Preliminary result)   Collection Time: 05/04/20 11:13 PM   Specimen: BLOOD  Result Value Ref Range Status   Specimen Description BLOOD RFOA  Final   Special Requests BOTTLES DRAWN AEROBIC AND ANAEROBIC BCAV  Final   Culture   Final    NO GROWTH 2 DAYS Performed at [facility], [address] [place], [place] [address]    Report Status PENDING  Incomplete  Blood Culture (routine x 2)     Status: None (Preliminary result)   Collection Time: 05/04/20 11:13 PM   Specimen: BLOOD  Result Value Ref Range Status   Specimen Description BLOOD LAC  Final   Special Requests BOTTLES DRAWN AEROBIC AND ANAEROBIC BCAV  Final   Culture   Final    NO GROWTH 3 DAYS Performed at [facility], [address] [place], [place] [address]    Report Status PENDING  Incomplete  Blood Culture (routine x 2)     Status: None (Preliminary result)   Collection Time: 05/06/20  2:19 PM   Specimen: BLOOD  Result Value Ref Range Status   Specimen Description BLOOD LEFT ANTECUBITAL  Final   Special Requests   Final      BOTTLES DRAWN AEROBIC AND ANAEROBIC Blood Culture results may not be optimal due to an inadequate volume of blood received in culture bottles   Culture   Final    NO GROWTH < 24 HOURS Performed at [facility], [address] [place], [place] [address]    Report Status PENDING  Incomplete  Blood Culture (routine x 2)     Status: None (Preliminary result)   Collection Time: 05/06/20  2:20 PM   Specimen: BLOOD  Result Value Ref Range Status   Specimen Description BLOOD RIGHT ANTECUBITAL  Final   Special Requests   Final    BOTTLES DRAWN AEROBIC AND ANAEROBIC Blood Culture adequate volume   Culture   Final    NO GROWTH < 24 HOURS Performed at [facility], [address], [place], [place] [address]    Report Status PENDING  Incomplete     Radiology Studies: DG Chest Portable 1 View  Result Date: 05/06/2020 CLINICAL DATA:  Shortness of breath EXAM: PORTABLE CHEST 1 VIEW COMPARISON:  August 11th 2021 FINDINGS: The cardiomediastinal silhouette is unchanged in contour. No pleural effusion. No pneumothorax. Similar appearance of patchy bilateral heterogeneous opacities peripherally. Peribronchial cuffing and perihilar vascular congestion. Visualized abdomen is unremarkable. No acute osseous abnormality. IMPRESSION: 1. Similar appearance of patchy bilateral heterogeneous opacities peripherally which may reflect the sequela of COVID 19 pneumonia. 2. Peribronchial cuffing and perihilar vascular congestion may reflect an underlying superimposed component of pulmonary edema. Electronically Signed   By: Meda Klinefelter MD   On: 05/06/2020 14:00    Scheduled Meds:  amLODipine  5 mg Oral Daily   vitamin C  500 mg Oral Daily   azelastine  1 spray Each Nare BID   baricitinib  4 mg Oral Daily   enoxaparin (LOVENOX) injection  40 mg Subcutaneous Q12H   escitalopram  10 mg Oral Daily   losartan  100 mg Oral Daily   And   hydrochlorothiazide  12.5 mg Oral Daily    insulin aspart  0-20 Units Subcutaneous Q4H   insulin detemir  18 Units Subcutaneous BID   ipratropium  2 puff Inhalation Q4H   methylPREDNISolone (SOLU-MEDROL) injection  40 mg Intravenous Q12H   metoprolol succinate  50 mg Oral Daily   thiamine  100 mg Oral Daily   zinc sulfate  220 mg Oral Daily   Continuous Infusions:  azithromycin Stopped (05/06/20 2325)   cefTRIAXone (ROCEPHIN)  IV Stopped (05/07/20 0015)   famotidine (PEPCID) IV Stopped (05/07/20 1208)   remdesivir 100 mg in NS 100 mL Stopped (05/07/20 1126)     LOS: 1 day   Time spent: 40 minutes.  Arnetha Courser, MD [facility]  If 7PM-7AM, please contact night-coverage [email or web address]  05/07/2020, 2:07 PM   This record has been created using Conservation officer, historic buildings. Errors have been sought and corrected,but may not always be located. Such creation errors do not reflect on the standard of care.

## 2020-05-07 NOTE — ED Notes (Signed)
Replaced pt's HFNC as pt had taken it off

## 2020-05-07 NOTE — ED Notes (Signed)
Pt removed oxygen and oxygen saturation decreased to 80 percent. RN at bedside. Pt informed of importance of oxygen administration. Pt verbalized understanding.

## 2020-05-07 NOTE — ED Notes (Signed)
Patient removed oxygen via HF [place]. RN to bedside. Oxygen placed back on patient. Patient reminded that oxygen needs to stay on patient in order for patient to continue living. Patient verbalized understanding. Admitting provider paged.

## 2020-05-07 NOTE — ED Notes (Signed)
This RN to room to find pt standing at end of bed with monitoring equipment and oxygen off- assisted pt back into bed and replaced equipment

## 2020-05-07 NOTE — ED Notes (Signed)
Pt had removed oxygen via [place] and oxygen saturation decreased. RN at bedside. Pt informed of importance of oxygen administration. Pt verbalized understanding. Oxygen put back into place.

## 2020-05-07 NOTE — ED Notes (Signed)
Pt placed back on monitoring equipment and given ice water

## 2020-05-07 NOTE — ED Notes (Signed)
RN at bedside. Pt had removed oxygen via [place]. Oxygen placed back on pt. Pt educated again about dangers of removing oxygen. Pt verbalized understanding.

## 2020-05-08 DIAGNOSIS — J1282 Pneumonia due to coronavirus disease 2019: Secondary | ICD-10-CM

## 2020-05-08 DIAGNOSIS — J9601 Acute respiratory failure with hypoxia: Secondary | ICD-10-CM

## 2020-05-08 DIAGNOSIS — U071 COVID-19: Principal | ICD-10-CM

## 2020-05-08 DIAGNOSIS — E119 Type 2 diabetes mellitus without complications: Secondary | ICD-10-CM

## 2020-05-08 LAB — COMPREHENSIVE METABOLIC PANEL
ALT: 42 U/L (ref 0–44)
AST: 67 U/L — ABNORMAL HIGH (ref 15–41)
Albumin: 3.3 g/dL — ABNORMAL LOW (ref 3.5–5.0)
Alkaline Phosphatase: 65 U/L (ref 38–126)
Anion gap: 14 (ref 5–15)
BUN: 24 mg/dL — ABNORMAL HIGH (ref 6–20)
CO2: 26 mmol/L (ref 22–32)
Calcium: 8.7 mg/dL — ABNORMAL LOW (ref 8.9–10.3)
Chloride: 100 mmol/L (ref 98–111)
Creatinine, Ser: 0.94 mg/dL (ref 0.61–1.24)
GFR calc Af Amer: >60 mL/min (ref >60)
GFR calc non Af Amer: >60 mL/min (ref >60)
Glucose, Bld: 214 mg/dL — ABNORMAL HIGH (ref 70–99)
Potassium: 3.8 mmol/L (ref 3.5–5.1)
Sodium: 140 mmol/L (ref 135–145)
Total Bilirubin: 1 mg/dL (ref 0.3–1.2)
Total Protein: 7.6 g/dL (ref 6.5–8.1)

## 2020-05-08 LAB — CBC WITH DIFFERENTIAL/PLATELET
Abs Immature Granulocytes: 0.09 10*3/uL — ABNORMAL HIGH (ref 0.00–0.07)
Basophils Absolute: 0 10*3/uL (ref 0.0–0.1)
Basophils Relative: 0 %
Eosinophils Absolute: 0 10*3/uL (ref 0.0–0.5)
Eosinophils Relative: 0 %
HCT: 45.2 % (ref 39.0–52.0)
Hemoglobin: 14.8 g/dL (ref 13.0–17.0)
Immature Granulocytes: 1 %
Lymphocytes Relative: 10 %
Lymphs Abs: 1 10*3/uL (ref 0.7–4.0)
MCH: 27.8 pg (ref 26.0–34.0)
MCHC: 32.7 g/dL (ref 30.0–36.0)
MCV: 84.8 fL (ref 80.0–100.0)
Monocytes Absolute: 0.6 10*3/uL (ref 0.1–1.0)
Monocytes Relative: 7 %
Neutro Abs: 7.7 10*3/uL (ref 1.7–7.7)
Neutrophils Relative %: 82 %
Platelets: 386 10*3/uL (ref 150–400)
RBC: 5.33 MIL/uL (ref 4.22–5.81)
RDW: 13.7 % (ref 11.5–15.5)
WBC: 9.3 10*3/uL (ref 4.0–10.5)
nRBC: 0 % (ref 0.0–0.2)

## 2020-05-08 LAB — GLUCOSE, CAPILLARY
Glucose-Capillary: 214 mg/dL — ABNORMAL HIGH (ref 70–99)
Glucose-Capillary: 144 mg/dL — ABNORMAL HIGH (ref 70–99)
Glucose-Capillary: 177 mg/dL — ABNORMAL HIGH (ref 70–99)
Glucose-Capillary: 202 mg/dL — ABNORMAL HIGH (ref 70–99)
Glucose-Capillary: 230 mg/dL — ABNORMAL HIGH (ref 70–99)
Glucose-Capillary: 249 mg/dL — ABNORMAL HIGH (ref 70–99)
Glucose-Capillary: 234 mg/dL — ABNORMAL HIGH (ref 70–99)
Glucose-Capillary: 236 mg/dL — ABNORMAL HIGH (ref 70–99)
Glucose-Capillary: 346 mg/dL — ABNORMAL HIGH (ref 70–99)

## 2020-05-08 LAB — FIBRIN DERIVATIVES D-DIMER (ARMC ONLY): Fibrin derivatives D-dimer (ARMC): 1007.23 ng/mL (FEU) — ABNORMAL HIGH (ref 0.00–499.00)

## 2020-05-08 LAB — C-REACTIVE PROTEIN: CRP: 1.1 mg/dL — ABNORMAL HIGH (ref <1.0)

## 2020-05-08 LAB — FERRITIN: Ferritin: 671 ng/mL — ABNORMAL HIGH (ref 24–336)

## 2020-05-08 MED ORDER — FUROSEMIDE 10 MG/ML IJ SOLN
20.0000 mg | Freq: Once | INTRAMUSCULAR | Status: AC
  Administered 2020-05-08: 20 mg via INTRAVENOUS
  Filled 2020-05-08: qty 4

## 2020-05-08 MED ORDER — INSULIN DETEMIR 100 UNIT/ML ~~LOC~~ SOLN
25.0000 [IU] | Freq: Two times a day (BID) | SUBCUTANEOUS | Status: DC
  Filled 2020-05-08: qty 0.25

## 2020-05-08 MED ORDER — INSULIN DETEMIR 100 UNIT/ML ~~LOC~~ SOLN
25.0000 [IU] | Freq: Two times a day (BID) | SUBCUTANEOUS | Status: DC
  Administered 2020-05-08 – 2020-05-10 (×5): 25 [IU] via SUBCUTANEOUS
  Filled 2020-05-08 (×7): qty 0.25

## 2020-05-08 NOTE — Progress Notes (Signed)
Follow up - Critical Care Medicine Note  Patient Details:    Walter [name] is an 54 y.o. male who was admitted 06 May 2020 with pneumonia due to COVID-19 and acute respiratory failure with hypoxia secondary to the same.  Lines, Airways, Drains: PIV only  Anti-infectives:  Anti-infectives (From admission, onward)   Start     Dose/Rate Route Frequency Ordered Stop   05/06/20 2000  cefTRIAXone (ROCEPHIN) 1 g in sodium chloride 0.9 % 100 mL IVPB     Discontinue     1 g 200 mL/hr over 30 Minutes Intravenous Every 24 hours 05/06/20 1809     05/06/20 2000  azithromycin (ZITHROMAX) 500 mg in sodium chloride 0.9 % 250 mL IVPB     Discontinue     500 mg 250 mL/hr over 60 Minutes Intravenous Every 24 hours 05/06/20 1809     05/06/20 1500  remdesivir 100 mg in sodium chloride 0.9 % 100 mL IVPB        100 mg 200 mL/hr over 30 Minutes Intravenous Daily 05/06/20 1406 05/08/20 0949     Scheduled Meds: . amLODipine  5 mg Oral Daily  . vitamin C  500 mg Oral Daily  . azelastine  1 spray Each Nare BID  . baricitinib  4 mg Oral Daily  . enoxaparin (LOVENOX) injection  40 mg Subcutaneous Q12H  . escitalopram  10 mg Oral Daily  . losartan  100 mg Oral Daily   And  . hydrochlorothiazide  12.5 mg Oral Daily  . insulin aspart  0-20 Units Subcutaneous Q4H  . insulin aspart  5 Units Subcutaneous TID WC  . insulin detemir  25 Units Subcutaneous BID  . ipratropium  2 puff Inhalation Q4H  . methylPREDNISolone (SOLU-MEDROL) injection  40 mg Intravenous Q12H  . metoprolol succinate  50 mg Oral Daily  . thiamine  100 mg Oral Daily  . zinc sulfate  220 mg Oral Daily   Continuous Infusions: . azithromycin Stopped (05/08/20 0059)  . cefTRIAXone (ROCEPHIN)  IV Stopped (05/07/20 2255)  . famotidine (PEPCID) IV Stopped (05/08/20 1059)   PRN Meds:.acetaminophen, albuterol, citalopram, hydrALAZINE, hydrOXYzine   Microbiology: Results for orders placed or performed during the hospital encounter of  05/06/20  Blood Culture (routine x 2)     Status: None (Preliminary result)   Collection Time: 05/06/20  2:19 PM   Specimen: BLOOD  Result Value Ref Range Status   Specimen Description BLOOD LEFT ANTECUBITAL  Final   Special Requests   Final    BOTTLES DRAWN AEROBIC AND ANAEROBIC Blood Culture results may not be optimal due to an inadequate volume of blood received in culture bottles   Culture   Final    NO GROWTH 2 DAYS Performed at [facility], [address], [place], [place] [address]    Report Status PENDING  Incomplete  Blood Culture (routine x 2)     Status: None (Preliminary result)   Collection Time: 05/06/20  2:20 PM   Specimen: BLOOD  Result Value Ref Range Status   Specimen Description BLOOD RIGHT ANTECUBITAL  Final   Special Requests   Final    BOTTLES DRAWN AEROBIC AND ANAEROBIC Blood Culture adequate volume   Culture   Final    NO GROWTH 2 DAYS Performed at [facility], [address] [place], [place] [address]    Report Status PENDING  Incomplete    Best Practice/Protocols:  VTE Prophylaxis: Lovenox (Prophylactic dose adjusted to weight and GFR) GI  Prophylaxis: Antihistamine   Events: 05/05/2020: Presented to ED with hypoxia and diagnosed with COVID-19 pneumonia, left AMA 05/06/2020: Returned to ED with increasing shortness of breath, admitted for management of COVID-19 pneumonia and acute hypoxic respiratory failure requiring high flow O2 05/07/2020: No new events, uneventful night.  High flow O2 being titrated 05/08/2020: No events overnight, becoming more "fidgety", does not endorse alcohol use prior to admission   Studies: DG Chest 1 View  Result Date: 05/04/2020 CLINICAL DATA:  Shortness of breath history of COVID EXAM: CHEST  1 VIEW COMPARISON:  05/01/2015 FINDINGS: Patchy bilateral vague airspace opacities and consolidations. Enlarged cardiomediastinal silhouette with mild central vascular congestion. No pleural effusion or  pneumothorax. IMPRESSION: Patchy bilateral airspace opacities and consolidations concerning for bilateral pneumonia. There is cardiomegaly with mild central vascular congestion. Electronically Signed   By: Jasmine Pang M.D.   On: 05/04/2020 23:11   DG Chest Portable 1 View  Result Date: 05/06/2020 CLINICAL DATA:  Shortness of breath EXAM: PORTABLE CHEST 1 VIEW COMPARISON:  August 11th 2021 FINDINGS: The cardiomediastinal silhouette is unchanged in contour. No pleural effusion. No pneumothorax. Similar appearance of patchy bilateral heterogeneous opacities peripherally. Peribronchial cuffing and perihilar vascular congestion. Visualized abdomen is unremarkable. No acute osseous abnormality. IMPRESSION: 1. Similar appearance of patchy bilateral heterogeneous opacities peripherally which may reflect the sequela of COVID 19 pneumonia. 2. Peribronchial cuffing and perihilar vascular congestion may reflect an underlying superimposed component of pulmonary edema. Electronically Signed   By: Meda Klinefelter MD   On: 05/06/2020 14:00    Consults: [facility]  Subjective:    Overnight Issues: No overnight issues, feels somewhat better.  Becoming more "fidgety" fiddles with monitoring equipment, monitors, paces in the room.  Attempting to wean oxygen off.  Continues at 50% FiO2 at 40 L flow.  No other concerns or complaints voiced.  Objective:  Vital signs for last 24 hours: Temp:  [97.8 F (36.6 C)-98.5 F (36.9 C)] 97.8 F (36.6 C) (08/15 0756) Pulse Rate:  [58-89] 68 (08/15 1643) Resp:  [18-23] 19 (08/15 1643) BP: (112-145)/(65-86) 128/66 (08/15 1643) SpO2:  [86 %-100 %] 91 % (08/15 1643) FiO2 (%):  [50 %-60 %] 50 % (08/15 0830)  Hemodynamic parameters for last 24 hours:    Intake/Output from previous day: 08/14 0701 - 08/15 0700 In: 150 [IV Piggyback:150] Out: -   Intake/Output this shift: Total I/O In: 498.1 [IV Piggyback:498.1] Out: 200 [Urine:200]  Vent settings for last 24  hours: FiO2 (%):  [50 %-60 %] 50 %  Physical Exam:   physical examination is limited due to need for PPE/CAPR GENERAL: Patient is an obese gentleman in no acute distress.  He is awake alert comfortable on high flow nasal cannula O2.  He is somewhat impulsive. HEAD: Normocephalic, atraumatic.  EYES: Pupils equal, round, reactive to light.  No scleral icterus.  MOUTH: Mallampati class III, oral mucosa moist, no thrush. NECK: Supple. No thyromegaly. Trachea midline. No JVD.  Thick neck. No adenopathy. PULMONARY: No accessory muscle use.  Even respirations.  Air entry good bilaterally, faint pops/crackles at bases.   CARDIOVASCULAR: Monitor shows regular rhythm. GASTROINTESTINAL: Obese abdomen, soft. MUSCULOSKELETAL: No joint deformity, no clubbing, no edema.  NEUROLOGIC: No apparent focal deficits.  Speech is fluent. SKIN: Intact,warm,dry.  Limited exam shows no rashes.   PSYCH: Mood and behavior, impulsive.  Assessment/Plan:  Acute respiratory failure with hypoxia due to COVID-19 pneumonia Continue high flow O2, weaning, FiO2 requirements down Maintain oxygen saturations between 88 to 92%  And admit to MedSurg floor if able to transition to bubble high flow or nasal cannula Continue remdesivir and baricitinib Continue vitamin C, zinc, add thiamine Bronchodilators via MDI for mucociliary clearance Continue general supportive care Continue to monitor inflammatory markers, these are on downward trend  Diabetes mellitus in poor control On insulin per hospitalist service This issue adds complexity to his management  Acute kidney injury Avoid nephrotoxins as much as possible Monitor volume status Monitor hemodynamic status  Mild lactic acidosis Resolved    LOS: 2 days   Additional comments:   Critical Care Total Time*: Level 3 follow-up  C. Danice Goltz, MD [place] 05/08/2020  *This note was dictated using voice recognition software/Dragon.  Despite best efforts  to proofread, errors can occur which can change the meaning.  Any change was purely unintentional.
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
## 2020-05-08 NOTE — ED Notes (Signed)
Pt again removes oxygen, enters room and replaces at this time, sats had fallen to 88%, returns to 94%, pt refuses nrb at this time, high flow in place

## 2020-05-08 NOTE — ED Notes (Signed)
Pt sleeping in bed

## 2020-05-08 NOTE — ED Notes (Signed)
Lab contacted due to need for labs to be collected and inability by this nurse. States they will send someone to ED to collect lab as soon as they are able to

## 2020-05-08 NOTE — ED Notes (Signed)
Pt removed HFNC and pulse ox. I entered room and helped put on again and re-explained importance of leaving O2 on and not removing pulse ox probe. Pt verbalized understanding.

## 2020-05-08 NOTE — ED Notes (Signed)
CBG 230 

## 2020-05-08 NOTE — ED Notes (Signed)
Pt pulled oxygen off. Placed HFNC back on pt at this time.

## 2020-05-08 NOTE — ED Notes (Signed)
Pt again removes oxygen and sats to 82%, removed monitor leads and this nurse replaced back on nose

## 2020-05-08 NOTE — ED Notes (Signed)
Pt remains of high flow and NRB. Back to sleep at this time

## 2020-05-08 NOTE — ED Notes (Signed)
Jon Billings, NP notified that pt sats are remaining [phone] with heated hiflow and non rebreather, awaiting response.

## 2020-05-08 NOTE — ED Notes (Signed)
Pt monitor alarms in room showing sats at 83% and decreasing, nurse enters room and pt is standing beside bed removing all monitors and oxygen is already off pt. Pt is asked what he is doing and he states, "trying to go to bed." Pt asked to return to bed at this time. Oxygen placed back on pt and pulse ox placed. Pt sats at 69% at this time. Asked to take deep breaths through nose. Pt is asked to keep o2 on and stay in bed for his safety and health. Pt states that he is not use to "having a routine". This nurse asks pt how he can care for him. Pt states he does not know, nurse tells pt that he is being very unsafe and is endangering himself and pt apoligizes, states he will remain in bed. Nurse will continue to monitor due to this occurring every hour

## 2020-05-08 NOTE — ED Notes (Signed)
Pt reports he just finished eating not long ago. 96% on HFNC.

## 2020-05-08 NOTE — ED Notes (Signed)
Pt continues to take off monitor leads and non-rebreather. Pt will keep pulse ox, IV and hi flow [place]. Will continue to monitor Lights off and pt states he is going to attempt to sleep again

## 2020-05-08 NOTE — ED Notes (Signed)
Pt walking around room, has HFNC on at this time.

## 2020-05-08 NOTE — ED Notes (Signed)
Pt in bed, pt is Alert, oriented x 4 but appears to be confused, some things pt speaks of does not make sense, pt struggles to understand the importance of oxygen and need to be in hospital, educated by this nurse and states understanding

## 2020-05-08 NOTE — ED Notes (Signed)
Pt agrees to use urinal next time he has to urinate and to leave it for this RN to measure. Bed locked low. Rail up. Call bell within reach. Pt had HFNC off when this RN entered room. Educated why it needs to stay in place. Pt was 85% RA when he had it off.

## 2020-05-08 NOTE — ED Notes (Signed)
Pt remains at baseline confusion. Pt thinks he knows this RN's parents and keeps telling a story about "them" as children. Pt also thinks this RN has a brother. Pt calm and can be redirected.

## 2020-05-08 NOTE — ED Notes (Signed)
This RN told upon report that provider Nelson Chimes S verbal order okay with pt O2 sat maintained above 88%. Reported to this RN that pt takes off HFNC often and does not keep on mask or monitor, BP cuff, pulse ox regularly either. Pt baseline confusion since getting covid per previous nurse.

## 2020-05-08 NOTE — ED Notes (Signed)
Lab at bedside

## 2020-05-08 NOTE — ED Notes (Signed)
Pt into hall, pt had removed all monitors and placed street clothes on, informed nurse that he was going to get something to eat. Pt redirected back to room, This nurse returns oxygen to pt, connects to monitor, pt sats at 58% when reconnected. Return to 90%. Pt educated again on importance of remaining in bed, sandwich tray to pt room but pt states he does not want to eat anything. Pt remains confused but alert, oriented x4. Pt instructed on negative impacts of actions to health, states he will stay in bed, will continue to monitor.

## 2020-05-08 NOTE — ED Notes (Signed)
Pt provided dinner tray.

## 2020-05-08 NOTE — ED Notes (Signed)
Pt had HFNC around his neck. Placed back into nose. Pt continues to pull at cords in room and touch equipment in room.

## 2020-05-08 NOTE — ED Notes (Signed)
Breakfast tray placed at bedside. Pt denies being hungry at this time. Reminded to keep HFNC in nose. Pt had it on forehead.

## 2020-05-09 ENCOUNTER — Encounter: Payer: Self-pay | Admitting: Internal Medicine

## 2020-05-09 LAB — COMPREHENSIVE METABOLIC PANEL
ALT: 63 U/L — ABNORMAL HIGH (ref 0–44)
AST: 81 U/L — ABNORMAL HIGH (ref 15–41)
Albumin: 3.3 g/dL — ABNORMAL LOW (ref 3.5–5.0)
Alkaline Phosphatase: 64 U/L (ref 38–126)
Anion gap: 12 (ref 5–15)
BUN: 23 mg/dL — ABNORMAL HIGH (ref 6–20)
CO2: 26 mmol/L (ref 22–32)
Calcium: 8.7 mg/dL — ABNORMAL LOW (ref 8.9–10.3)
Chloride: 96 mmol/L — ABNORMAL LOW (ref 98–111)
Creatinine, Ser: 0.73 mg/dL (ref 0.61–1.24)
GFR calc Af Amer: >60 mL/min (ref >60)
GFR calc non Af Amer: >60 mL/min (ref >60)
Glucose, Bld: 275 mg/dL — ABNORMAL HIGH (ref 70–99)
Potassium: 3.9 mmol/L (ref 3.5–5.1)
Sodium: 134 mmol/L — ABNORMAL LOW (ref 135–145)
Total Bilirubin: 0.8 mg/dL (ref 0.3–1.2)
Total Protein: 7.2 g/dL (ref 6.5–8.1)

## 2020-05-09 LAB — CULTURE, BLOOD (ROUTINE X 2): Culture: NO GROWTH

## 2020-05-09 LAB — CBC WITH DIFFERENTIAL/PLATELET
Abs Immature Granulocytes: 0.21 10*3/uL — ABNORMAL HIGH (ref 0.00–0.07)
Basophils Absolute: 0.1 10*3/uL (ref 0.0–0.1)
Basophils Relative: 0 %
Eosinophils Absolute: 0 10*3/uL (ref 0.0–0.5)
Eosinophils Relative: 0 %
HCT: 45.9 % (ref 39.0–52.0)
Hemoglobin: 15.9 g/dL (ref 13.0–17.0)
Immature Granulocytes: 2 %
Lymphocytes Relative: 25 %
Lymphs Abs: 3.1 10*3/uL (ref 0.7–4.0)
MCH: 28.5 pg (ref 26.0–34.0)
MCHC: 34.6 g/dL (ref 30.0–36.0)
MCV: 82.3 fL (ref 80.0–100.0)
Monocytes Absolute: 1.2 10*3/uL — ABNORMAL HIGH (ref 0.1–1.0)
Monocytes Relative: 10 %
Neutro Abs: 7.9 10*3/uL — ABNORMAL HIGH (ref 1.7–7.7)
Neutrophils Relative %: 63 %
Platelets: 389 10*3/uL (ref 150–400)
RBC: 5.58 MIL/uL (ref 4.22–5.81)
RDW: 13.4 % (ref 11.5–15.5)
Smear Review: NORMAL
WBC: 12.6 10*3/uL — ABNORMAL HIGH (ref 4.0–10.5)
nRBC: 0 % (ref 0.0–0.2)

## 2020-05-09 LAB — FERRITIN: Ferritin: 749 ng/mL — ABNORMAL HIGH (ref 24–336)

## 2020-05-09 LAB — LACTIC ACID, PLASMA: Lactic Acid, Venous: 1.6 mmol/L (ref 0.5–1.9)

## 2020-05-09 LAB — GLUCOSE, CAPILLARY
Glucose-Capillary: 122 mg/dL — ABNORMAL HIGH (ref 70–99)
Glucose-Capillary: 104 mg/dL — ABNORMAL HIGH (ref 70–99)
Glucose-Capillary: 168 mg/dL — ABNORMAL HIGH (ref 70–99)
Glucose-Capillary: 288 mg/dL — ABNORMAL HIGH (ref 70–99)
Glucose-Capillary: 294 mg/dL — ABNORMAL HIGH (ref 70–99)

## 2020-05-09 LAB — FIBRIN DERIVATIVES D-DIMER (ARMC ONLY): Fibrin derivatives D-dimer (ARMC): 892.96 ng/mL (FEU) — ABNORMAL HIGH (ref 0.00–499.00)

## 2020-05-09 LAB — C-REACTIVE PROTEIN: CRP: 1.5 mg/dL — ABNORMAL HIGH (ref <1.0)

## 2020-05-09 MED ORDER — AMLODIPINE BESYLATE 10 MG PO TABS
10.0000 mg | ORAL_TABLET | Freq: Every day | ORAL | Status: DC
  Administered 2020-05-09 – 2020-05-10 (×2): 10 mg via ORAL
  Filled 2020-05-09: qty 2
  Filled 2020-05-09: qty 1

## 2020-05-09 MED ORDER — ORAL CARE MOUTH RINSE
15.0000 mL | Freq: Two times a day (BID) | OROMUCOSAL | Status: DC
  Administered 2020-05-09 – 2020-05-10 (×2): 15 mL via OROMUCOSAL

## 2020-05-09 NOTE — ED Notes (Signed)
Long conversation with patient regarding his noncompliance with oxygen therapy. He states he knows what we are telling him but he just has done what he wants all his life. Patient sternly instructed to stay in the bed and keep his oxygen mask on. Verbalizes understanding. Will continue to monitor.

## 2020-05-09 NOTE — ED Notes (Signed)
Charge nurse notified patient will need a Recruitment consultant. States she will "pass it along."

## 2020-05-09 NOTE — ED Notes (Signed)
Patient spoke with Dr. Nelson Chimes

## 2020-05-09 NOTE — ED Notes (Signed)
Patient has been appropriate and cooperative, just wanting to know when he will be moved, he says he is ready to go due to business affairs he neees to handle

## 2020-05-09 NOTE — ED Notes (Signed)
EDT in to check patient's blood sugar

## 2020-05-09 NOTE — Care Plan (Signed)
Patient has been weaned down to 6 L O2 nasal cannula.  In no distress.  He can be now be downgraded from stepdown to MedSurg General admit.  May need to go home initially with O2 to wean off.  PCCM will sign off please reconsult as needed.  Gailen Shelter, MD [facility]

## 2020-05-09 NOTE — Progress Notes (Signed)
Patient refused pm scheduled Lovenox. Patient educated on the need for medication.

## 2020-05-09 NOTE — ED Notes (Signed)
Patient once again has taken off his NRB. Explained to patient once again that he must keep this equipment on under all circumstances. He states he understands. Will not leave blood pressure cuff and oxygen sensor in place. Will continue to monitor.

## 2020-05-09 NOTE — ED Notes (Signed)
Spoke with charge nurse Charli in ICU for patients bed and she said at this time they are unable to take this patient due to no available beds, she will call me back when an available bed opens up

## 2020-05-09 NOTE — Progress Notes (Signed)
PROGRESS NOTE    Walter [name]  ZOX:[id number] DOB: 05/25/1966 DOA: 05/06/2020 PCP: Gaspar Garbeisovec, Richard W, MD   Brief Narrative:  Walter [name]  is a 54 y.o. obese Caucasian male with a known history of hypertension and type diabetes mellitus was anxiety who presented to the emergency room with acute onset of shortness of breath since Thursday with associated cough mainly dry and occasionally productive of gray sputum as well as occasional wheezing.  He had fever and chills a couple of days last week.  Noted to diarrhea without nausea or vomiting.  He has been having loss of taste and smell. Patient is unvaccinated and found to be positive for COVID-19 PCR. Chest x-ray with bilateral airspace opacities consistent with COVID-19 pneumonia. Procalcitonin positive at 0.23. He was started on empiric antibiotics along with remdesivir and steroid. Patient left AMA after getting second dose of remdesivir on 8/12 and then came back again on 05/06/2020 with worsening hypoxia and shortness of breath.  Remdesivir was resumed and he was also started on baricitinib. Initially requiring heated HFNC at 50 L with 100% FiO2.  Subjective: Patient has no new complaint today.  He was saturating 100% with nonbreather.  Assessment & Plan:   Principal Problem:   Pneumonia due to COVID-19 virus Active Problems:   Acute respiratory failure with hypoxia (HCC)   Diabetes mellitus without complication (HCC)   Hypertension   Depression  Acute hypoxic respiratory failure secondary to COVID-19 pneumonia. Elevated inflammatory markers, not trending down, procalcitonin positive at 0.23 >>0.35, most likely some superadded bacterial infection.Initially requiring 50 L at 100% FiO2, able to wean down to 50% with 35 L of oxygen.  Left AMA for 1 day and then came back, no missed doses of remdesivir at this time. He was started on baricitinib due to worsening hypoxia.  Today he was saturating 100% on nonbreather. -Try weaning him down  to keep saturation above 90%. -Completed the course of remdesivir. -Continue Solu Medrol 40 mg twice daily. -Continue baricitinib. -Continue to monitor inflammatory markers. -Continue supportive care with supplemental oxygen. -Continue supportive care. -Continue ceftriaxone and Zithromax due to possible superadded bacterial infection.  Electrolyte abnormalities.  Patient had hypokalemia during initial admission, electrolytes within normal limit.  Pseudo hyponatremia secondary to hyperglycemia. -Continue to follow.  Type 2 diabetes mellitus.  A1c of 9.9.  Patient uses 70/30 at home. CBG within goal.  He was started on Levemir and sliding scale during current admission. -Continue Levemir to 25 units twice daily -Continue 5 units with meals. -Continue resistant sliding scale.  Hypertension. Blood pressure elevated today. -Increase home dose of amlodipine from 5 to 10 mg daily. -Continue home dose of  Hyzaar and Toprol XL.  Depression. No acute concern. -Continue home dose of Celexa.  Morbid obesity. Body mass index is 40.41 kg/m.  This can complicate overall prognosis.  Objective: Vitals:   05/09/20 0700 05/09/20 0730 05/09/20 0800 05/09/20 0947  BP: (!) 155/87 (!) 164/85 (!) 154/138   Pulse: 64 74 70   Resp:      Temp:      TempSrc:      SpO2: 98% 96% 100% 92%  Weight:      Height:        Intake/Output Summary (Last 24 hours) at 05/09/2020 1442 Last data filed at 05/08/2020 2134 Gross per 24 hour  Intake --  Output 300 ml  Net -300 ml   Filed Weights   05/06/20 1350  Weight: 117 kg    Examination:  General.  Well-developed gentleman, in no acute distress. Pulmonary.  Lungs clear bilaterally, normal respiratory effort. CV.  Regular rate and rhythm, no JVD, rub or murmur. Abdomen.  Soft, nontender, nondistended, BS positive. CNS.  Alert and oriented x3.  No focal neurologic deficit. Extremities.  No edema, no cyanosis, pulses intact and symmetrical. Psychiatry.   Judgment and insight appears normal.  DVT prophylaxis: Lovenox Code Status: Full Family Communication: Discussed with patient.  Call daughter with no response. Disposition Plan:  Status is: Inpatient  Remains inpatient appropriate because:Inpatient level of care appropriate due to severity of illness   Dispo: The patient is from: Home              Anticipated d/c is to: Home              Anticipated d/c date is: 2-3 days.              Patient currently is not medically stable to d/c.  Patient is high risk for sudden deterioration as he is not compliant and continue to take his oxygen off requiring persistent and repetitive reminders.  Consultants:   None  Procedures:  Antimicrobials:  Zithromax Ceftriaxone.  Data Reviewed: I have personally reviewed following labs and imaging studies  CBC: Recent Labs  Lab 05/04/20 2313 05/06/20 1254 05/07/20 0606 05/08/20 0859  WBC 7.9 13.3* 7.2 9.3  NEUTROABS  --   --  5.7 7.7  HGB 13.7 14.6 14.0 14.8  HCT 41.0 41.7 41.2 45.2  MCV 83.8 82.2 84.4 84.8  PLT 154 270 270 386   Basic Metabolic Panel: Recent Labs  Lab 05/04/20 2313 05/05/20 0411 05/05/20 1228 05/06/20 1254 05/08/20 0859  NA 128*  --  133* 134* 140  K 3.3*  --  3.8 4.3 3.8  CL 91*  --  97* 98 100  CO2 23  --  23 23 26   GLUCOSE 258*  --  450* 457* 214*  BUN 21*  --  25* 35* 24*  CREATININE 1.30*  --  1.16 1.16 0.94  CALCIUM 7.8*  --  7.9* 8.0* 8.7*  MG  --  2.1  --   --   --    GFR: Estimated Creatinine Clearance: 109.9 mL/min (by C-G formula based on SCr of 0.94 mg/dL). Liver Function Tests: Recent Labs  Lab 05/05/20 1228 05/06/20 1254 05/08/20 0859  AST 44* 51* 67*  ALT 29 30 42  ALKPHOS 53 54 65  BILITOT 1.1 1.0 1.0  PROT 7.1 7.2 7.6  ALBUMIN 3.0* 3.0* 3.3*   No results for input(s): LIPASE, AMYLASE in the last 168 hours. No results for input(s): AMMONIA in the last 168 hours. Coagulation Profile: Recent Labs  Lab 05/04/20 2313  INR 1.0    Cardiac Enzymes: No results for input(s): CKTOTAL, CKMB, CKMBINDEX, TROPONINI in the last 168 hours. BNP (last 3 results) No results for input(s): PROBNP in the last 8760 hours. HbA1C: No results for input(s): HGBA1C in the last 72 hours. CBG: Recent Labs  Lab 05/08/20 1655 05/08/20 1759 05/08/20 2018 05/09/20 0533 05/09/20 1334  GLUCAP 234* 236* 346* 104* 168*   Lipid Profile: No results for input(s): CHOL, HDL, LDLCALC, TRIG, CHOLHDL, LDLDIRECT in the last 72 hours. Thyroid Function Tests: No results for input(s): TSH, T4TOTAL, FREET4, T3FREE, THYROIDAB in the last 72 hours. Anemia Panel: Recent Labs    05/07/20 0606 05/08/20 0859  FERRITIN 713* 671*   Sepsis Labs: Recent Labs  Lab 05/04/20 2313 05/05/20 0500 05/06/20 1419  05/06/20 1658 05/07/20 1655  PROCALCITON 0.23 0.23 0.35  --   --   LATICACIDVEN 1.6  --  2.1* 2.2* 1.6    Recent Results (from the past 240 hour(s))  Blood Culture (routine x 2)     Status: None (Preliminary result)   Collection Time: 05/04/20 11:13 PM   Specimen: BLOOD  Result Value Ref Range Status   Specimen Description BLOOD RFOA  Final   Special Requests BOTTLES DRAWN AEROBIC AND ANAEROBIC BCAV  Final   Culture   Final    NO GROWTH 4 DAYS Performed at [facility], [address], [place], [place] [address]    Report Status PENDING  Incomplete  Blood Culture (routine x 2)     Status: None   Collection Time: 05/04/20 11:13 PM   Specimen: BLOOD  Result Value Ref Range Status   Specimen Description BLOOD LAC  Final   Special Requests BOTTLES DRAWN AEROBIC AND ANAEROBIC BCAV  Final   Culture   Final    NO GROWTH 5 DAYS Performed at [facility], [address] [place], [place] [address]    Report Status 05/09/2020 FINAL  Final  Blood Culture (routine x 2)     Status: None (Preliminary result)   Collection Time: 05/06/20  2:19 PM   Specimen: BLOOD  Result Value Ref Range Status   Specimen Description BLOOD  LEFT ANTECUBITAL  Final   Special Requests   Final    BOTTLES DRAWN AEROBIC AND ANAEROBIC Blood Culture results may not be optimal due to an inadequate volume of blood received in culture bottles   Culture   Final    NO GROWTH 3 DAYS Performed at [facility], [address], [place], [place] [address]    Report Status PENDING  Incomplete  Blood Culture (routine x 2)     Status: None (Preliminary result)   Collection Time: 05/06/20  2:20 PM   Specimen: BLOOD  Result Value Ref Range Status   Specimen Description BLOOD RIGHT ANTECUBITAL  Final   Special Requests   Final    BOTTLES DRAWN AEROBIC AND ANAEROBIC Blood Culture adequate volume   Culture   Final    NO GROWTH 3 DAYS Performed at [facility], [address] [place], [place] [address]    Report Status PENDING  Incomplete     Radiology Studies: No results found.  Scheduled Meds: . amLODipine  10 mg Oral Daily  . vitamin C  500 mg Oral Daily  . azelastine  1 spray Each Nare BID  . baricitinib  4 mg Oral Daily  . enoxaparin (LOVENOX) injection  40 mg Subcutaneous Q12H  . escitalopram  10 mg Oral Daily  . losartan  100 mg Oral Daily   And  . hydrochlorothiazide  12.5 mg Oral Daily  . insulin aspart  0-20 Units Subcutaneous Q4H  . insulin aspart  5 Units Subcutaneous TID WC  . insulin detemir  25 Units Subcutaneous BID  . ipratropium  2 puff Inhalation Q4H  . methylPREDNISolone (SOLU-MEDROL) injection  40 mg Intravenous Q12H  . metoprolol succinate  50 mg Oral Daily  . thiamine  100 mg Oral Daily  . zinc sulfate  220 mg Oral Daily   Continuous Infusions: . azithromycin Stopped (05/08/20 2212)  . cefTRIAXone (ROCEPHIN)  IV Stopped (05/08/20 4585)  . famotidine (PEPCID) IV Stopped (05/08/20 1059)     LOS: 3 days   Time spent: 40 minutes.  Arnetha Courser, MD [facility]  If 7PM-7AM, please contact night-coverage [email or web address]  05/09/2020, 2:42 PM   This record has been created using  Conservation officer, historic buildings. Errors have been sought and corrected,but may not always be located. Such creation errors do not reflect on the standard of care.

## 2020-05-09 NOTE — ED Notes (Signed)
Patient up out of bed to the doorway. States he is going to walk down there and get him a diet Mt. Dew. Told patient to get in bed and leave the oxygen mask on. He states "oh what's wrong?" Told patient has had Covid and to stop opening the door and exposing everybody. Patient shuts the door and continues to walk about the room.

## 2020-05-09 NOTE — ED Notes (Signed)
NRB off again. Patient instructed to keep it on. States he will. Will continue to monitor.

## 2020-05-09 NOTE — Progress Notes (Signed)
   05/09/20 1715  Oxygen Therapy  SpO2 (!) 77 %  O2 Device Room Air  pt requires 5L o2 to maintain sats 94%

## 2020-05-10 LAB — COMPREHENSIVE METABOLIC PANEL
ALT: 54 U/L — ABNORMAL HIGH (ref 0–44)
AST: 50 U/L — ABNORMAL HIGH (ref 15–41)
Albumin: 3 g/dL — ABNORMAL LOW (ref 3.5–5.0)
Alkaline Phosphatase: 54 U/L (ref 38–126)
Anion gap: 7 (ref 5–15)
BUN: 22 mg/dL — ABNORMAL HIGH (ref 6–20)
CO2: 29 mmol/L (ref 22–32)
Calcium: 8.5 mg/dL — ABNORMAL LOW (ref 8.9–10.3)
Chloride: 101 mmol/L (ref 98–111)
Creatinine, Ser: 0.78 mg/dL (ref 0.61–1.24)
GFR calc Af Amer: >60 mL/min (ref >60)
GFR calc non Af Amer: >60 mL/min (ref >60)
Glucose, Bld: 254 mg/dL — ABNORMAL HIGH (ref 70–99)
Potassium: 4.7 mmol/L (ref 3.5–5.1)
Sodium: 137 mmol/L (ref 135–145)
Total Bilirubin: 0.7 mg/dL (ref 0.3–1.2)
Total Protein: 6.5 g/dL (ref 6.5–8.1)

## 2020-05-10 LAB — CBC WITH DIFFERENTIAL/PLATELET
Abs Immature Granulocytes: 0.13 10*3/uL — ABNORMAL HIGH (ref 0.00–0.07)
Basophils Absolute: 0 10*3/uL (ref 0.0–0.1)
Basophils Relative: 0 %
Eosinophils Absolute: 0 10*3/uL (ref 0.0–0.5)
Eosinophils Relative: 0 %
HCT: 42.3 % (ref 39.0–52.0)
Hemoglobin: 14.2 g/dL (ref 13.0–17.0)
Immature Granulocytes: 2 %
Lymphocytes Relative: 15 %
Lymphs Abs: 1.3 10*3/uL (ref 0.7–4.0)
MCH: 28.6 pg (ref 26.0–34.0)
MCHC: 33.6 g/dL (ref 30.0–36.0)
MCV: 85.3 fL (ref 80.0–100.0)
Monocytes Absolute: 0.7 10*3/uL (ref 0.1–1.0)
Monocytes Relative: 7 %
Neutro Abs: 6.8 10*3/uL (ref 1.7–7.7)
Neutrophils Relative %: 76 %
Platelets: 311 10*3/uL (ref 150–400)
RBC: 4.96 MIL/uL (ref 4.22–5.81)
RDW: 13.4 % (ref 11.5–15.5)
Smear Review: NORMAL
WBC: 8.9 10*3/uL (ref 4.0–10.5)
nRBC: 0 % (ref 0.0–0.2)

## 2020-05-10 LAB — GLUCOSE, CAPILLARY
Glucose-Capillary: 286 mg/dL — ABNORMAL HIGH (ref 70–99)
Glucose-Capillary: 261 mg/dL — ABNORMAL HIGH (ref 70–99)
Glucose-Capillary: 232 mg/dL — ABNORMAL HIGH (ref 70–99)
Glucose-Capillary: 263 mg/dL — ABNORMAL HIGH (ref 70–99)

## 2020-05-10 LAB — CULTURE, BLOOD (ROUTINE X 2): Culture: NO GROWTH

## 2020-05-10 LAB — FIBRIN DERIVATIVES D-DIMER (ARMC ONLY): Fibrin derivatives D-dimer (ARMC): 866.13 ng/mL (FEU) — ABNORMAL HIGH (ref 0.00–499.00)

## 2020-05-10 LAB — C-REACTIVE PROTEIN: CRP: 0.7 mg/dL (ref <1.0)

## 2020-05-10 LAB — FERRITIN: Ferritin: 557 ng/mL — ABNORMAL HIGH (ref 24–336)

## 2020-05-10 MED ORDER — ALBUTEROL SULFATE HFA 108 (90 BASE) MCG/ACT IN AERS: 2.0000 | INHALATION_SPRAY | RESPIRATORY_TRACT | 0 refills | Status: AC | PRN

## 2020-05-10 MED ORDER — ACETAMINOPHEN 325 MG PO TABS: 650.0000 mg | ORAL_TABLET | Freq: Four times a day (QID) | ORAL | 0 refills | Status: AC | PRN

## 2020-05-10 MED ORDER — INSULIN ASPART 100 UNIT/ML ~~LOC~~ SOLN
0.0000 [IU] | Freq: Three times a day (TID) | SUBCUTANEOUS | Status: DC
  Administered 2020-05-10: 08:00:00 7 [IU] via SUBCUTANEOUS
  Administered 2020-05-10: 13:00:00 11 [IU] via SUBCUTANEOUS
  Filled 2020-05-10 (×2): qty 1

## 2020-05-10 MED ORDER — BARICITINIB 2 MG PO TABS: 4.0000 mg | ORAL_TABLET | Freq: Every day | ORAL | 0 refills | Status: AC

## 2020-05-10 MED ORDER — ASCORBIC ACID 500 MG PO TABS: 500.0000 mg | ORAL_TABLET | Freq: Every day | ORAL | 0 refills | Status: AC

## 2020-05-10 MED ORDER — AMLODIPINE BESYLATE 10 MG PO TABS: 10.0000 mg | ORAL_TABLET | Freq: Every day | ORAL | 0 refills | Status: AC

## 2020-05-10 MED ORDER — THIAMINE HCL 100 MG PO TABS: 100.0000 mg | ORAL_TABLET | Freq: Every day | ORAL | 0 refills | Status: AC

## 2020-05-10 MED ORDER — IPRATROPIUM BROMIDE HFA 17 MCG/ACT IN AERS: 2.0000 | INHALATION_SPRAY | RESPIRATORY_TRACT | 12 refills | Status: AC

## 2020-05-10 MED ORDER — DEXAMETHASONE 6 MG PO TABS
6.0000 mg | ORAL_TABLET | Freq: Every day | ORAL | 0 refills | Status: AC
Start: 2020-05-10 — End: 2020-05-14

## 2020-05-10 MED ORDER — ZINC SULFATE 220 (50 ZN) MG PO CAPS: 220.0000 mg | ORAL_CAPSULE | Freq: Every day | ORAL | 0 refills | Status: AC

## 2020-05-10 NOTE — Progress Notes (Signed)
°  Chaplain On-Call responded to Order Requisition for Advance Directives information for patient.  Due to patient's diagnosis of Coronavirus, Chaplain gave AD documents to RN Melissa to give to patient.  Chaplains can contact patient by phone if he requests additional information.  Chaplain Clinten Howk B. Tabitha Riggins M.Div., [facility]

## 2020-05-10 NOTE — Progress Notes (Signed)
Inpatient Diabetes Program Recommendations  AACE/ADA: New Consensus Statement on Inpatient Glycemic Control (2015)  Target Ranges:  Prepandial:   less than 140 mg/dL      Peak postprandial:   less than 180 mg/dL (1-2 hours)      Critically ill patients:  140 - 180 mg/dL   Lab Results  Component Value Date   GLUCAP 232 (H) 05/10/2020   HGBA1C 9.9 (H) 05/05/2020    Review of Glycemic Control Results for Walter [name], Walter [name] (MRN [id number]) as of 05/10/2020 08:04  Ref. Range 05/09/2020 19:55 05/10/2020 02:05 05/10/2020 05:55 05/10/2020 07:48  Glucose-Capillary Latest Ref Range: 70 - 99 mg/dL 407 (H) 680 (H) 881 (H) 232 (H)   Diabetes history: DM 2 Home DM Meds: 70/30 Insulin 70-75 units TID Current orders for Inpatient glycemic control:  Novolog resistant tid with meals Levemir 25 units bid Novolog 5 units tid with meals Solumedrol 40 mg IV q 12 hours Inpatient Diabetes Program Recommendations:   Please consider increasing Levemir to 30 units bid and increase Novolog meal coverage to 7 units tid with meals.   Thanks,  Beryl Meager, RN, BC-ADM Inpatient Diabetes Coordinator Pager [phone] (8a-5p)

## 2020-05-10 NOTE — Discharge Summary (Signed)
Physician Discharge Summary  Walter [name] Walter [name] CZY:[id number] DOB: May 20, 1966 DOA: 05/06/2020  PCP: Walter Garbeisovec, Walter W, MD  Admit date: 05/06/2020 Discharge date: 05/10/2020  Admitted From: Home Disposition:  Home  Recommendations for Outpatient Follow-up:  1. Follow up with PCP in 1-2 weeks 2. Please obtain BMP/CBC in one week 3. Please follow up on the following pending results: None  Home Health:No  Equipment/Devices: Home oxygen Discharge Condition: Stable CODE STATUS: Full Diet recommendation: Heart Healthy / Carb Modified   Brief/Interim Summary: [name] who presented to the emergency room with acute onset ofshortness of breath since Thursday with associated cough mainly dry and occasionally productive of gray sputum as well as occasional wheezing. He had fever and chills a couple of days last week. Noted to diarrhea without nausea or vomiting. He has been having loss of taste and smell. Patient is unvaccinated and found to be positive for COVID-19 PCR. Chest x-ray with bilateral airspace opacities consistent with COVID-19 pneumonia. Procalcitonin positive at 0.23. He was started on empiric antibiotics along with remdesivir and steroid. Patient left AMA after getting second dose of remdesivir on 8/12 and then came back again on 05/06/2020 with worsening hypoxia and shortness of breath.  Remdesivir was resumed and he was also started on baricitinib. Initially requiring heated HFNC at maximum settings, we were able to wean him down to 4L at rest and 6-7 with ambulation. He completed a course of remdesivir and discharged home with home oxygen, baricitinib and 4 more days of Decadron. Patient shows some cognitive impairment during current hospitalization. Required frequent reminders about the use of oxygen as he had a tendency to take the oxygen off and saturating in 70s with no  sign of distress. Discussed with daughter as he might need continuous reminders to use oxygen at home and he will need a close follow-up with his primary care provider for further management. Patient will need an outpatient evaluation once out of this acute illness for his cognitive impairment. 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  Patient did had some electrolyte abnormalities during current hospitalization's which were repleted and monitored.  Patient has uncontrolled diabetes with A1c of 9.9. CBG was elevated as patient was on steroid. He was managed with Lantus and NovoLog while in the hospital and will continue with his home regimen of 70/30. He will need a close follow-up with primary care provider.  Patient continued to have elevated blood pressure. His home dose of amlodipine was increased from 5 to 10 mg daily with some improvement in his blood pressure. He will continue home dose of his R and Toprol and will follow up with his PCP.  Patient is morbidly obese with BMI of 40.41 which can complicate overall prognosis. He needs extensive counseling for weight reduction.  Discharge Diagnoses:  Principal Problem:   Pneumonia due to COVID-19 virus Active Problems:   Acute respiratory failure with hypoxia (HCC)   Diabetes mellitus without complication (HCC)   Hypertension   Depression   Discharge Instructions  Discharge Instructions    Diet - low sodium heart healthy   Complete by: As directed    Discharge instructions   Complete by: As directed    It was pleasure taking care of you. You are being provided with oxygen, you have to use 4 L while resting and can go up to 6-7L whenever you are moving around. Please follow-up with your primary care provider so  they can determine the further need for continuation of oxygen. You are also being provided with 4 more days of steroid and 10 more days of another medicine called baricitinib for your COVID-19 pneumonia. Please take it as  directed. Please keep your self quarantine for another week. Keep yourself well-hydrated and follow-up with your primary care provider.   Increase activity slowly   Complete by: As directed      Allergies as of 05/10/2020   No Known Allergies     Medication List    STOP taking these medications   escitalopram 10 MG tablet Commonly known as: Lexapro     TAKE these medications   acetaminophen 325 MG tablet Commonly known as: TYLENOL Take 2 tablets (650 mg total) by mouth every 6 (six) hours as needed for fever, headache or moderate pain.   albuterol 108 (90 Base) MCG/ACT inhaler Commonly known as: VENTOLIN HFA Inhale 2 puffs into the lungs every 4 (four) hours as needed for wheezing or shortness of breath.   amLODipine 10 MG tablet Commonly known as: NORVASC Take 1 tablet (10 mg total) by mouth daily. Start taking on: May 11, 2020 What changed:   medication strength  how much to take   ascorbic acid 500 MG tablet Commonly known as: VITAMIN C Take 1 tablet (500 mg total) by mouth daily. Start taking on: May 11, 2020   Azelastine HCl 137 MCG/SPRAY Soln Place 1 spray into both nostrils 2 (two) times daily.   baricitinib tablet Commonly known as: OLUMIANT Take 2 tablets (4 mg total) by mouth daily for 10 days. Start taking on: May 11, 2020   citalopram 20 MG tablet Commonly known as: CELEXA Take 20 mg by mouth daily as needed (for anxiety.).   dexamethasone 6 MG tablet Commonly known as: DECADRON Take 1 tablet (6 mg total) by mouth daily for 4 days.   insulin NPH-regular Human (70-30) 100 UNIT/ML injection Inject 70-75 Units into the skin 3 (three) times daily.   ipratropium 17 MCG/ACT inhaler Commonly known as: ATROVENT HFA Inhale 2 puffs into the lungs every 4 (four) hours.   losartan-hydrochlorothiazide 100-12.5 MG tablet Commonly known as: HYZAAR Take 1 tablet by mouth daily.   metoprolol succinate 50 MG 24 hr tablet Commonly known as:  TOPROL-XL Take 50 mg by mouth daily. Take with or immediately following a meal.   thiamine 100 MG tablet Take 1 tablet (100 mg total) by mouth daily. Start taking on: May 11, 2020   zinc sulfate 220 (50 Zn) MG capsule Take 1 capsule (220 mg total) by mouth daily. Start taking on: May 11, 2020            Durable Medical Equipment  (From admission, onward)         Start     Ordered   05/10/20 1034  For home use only DME oxygen  Once       Question Answer Comment  Length of Need 6 Months   Mode or (Route) Nasal cannula   Liters per Minute 6   Frequency Continuous (stationary and portable oxygen unit needed)   Oxygen conserving device Yes   Oxygen delivery system Gas      05/10/20 1033          Follow-up Information    Tisovec, Adelfa Koh, MD. Schedule an appointment as soon as possible for a visit.   Specialty: Internal Medicine Contact information: [address] [place] [address] [phone]  No Known Allergies  Consultations:  Pulmonary  Procedures/Studies: DG Chest 1 View  Result Date: 05/04/2020 CLINICAL DATA:  Shortness of breath history of COVID EXAM: CHEST  1 VIEW COMPARISON:  05/01/2015 FINDINGS: Patchy bilateral vague airspace opacities and consolidations. Enlarged cardiomediastinal silhouette with mild central vascular congestion. No pleural effusion or pneumothorax. IMPRESSION: Patchy bilateral airspace opacities and consolidations concerning for bilateral pneumonia. There is cardiomegaly with mild central vascular congestion. Electronically Signed   By: Jasmine Pang M.D.   On: 05/04/2020 23:11   DG Chest Portable 1 View  Result Date: 05/06/2020 CLINICAL DATA:  Shortness of breath EXAM: PORTABLE CHEST 1 VIEW COMPARISON:  August 11th 2021 FINDINGS: The cardiomediastinal silhouette is unchanged in contour. No pleural effusion. No pneumothorax. Similar appearance of patchy bilateral heterogeneous opacities peripherally.  Peribronchial cuffing and perihilar vascular congestion. Visualized abdomen is unremarkable. No acute osseous abnormality. IMPRESSION: 1. Similar appearance of patchy bilateral heterogeneous opacities peripherally which may reflect the sequela of COVID 19 pneumonia. 2. Peribronchial cuffing and perihilar vascular congestion may reflect an underlying superimposed component of pulmonary edema. Electronically Signed   By: Meda Klinefelter MD   On: 05/06/2020 14:00     Subjective: Patient has no new complaint today. He was eager to leave for home.  Discharge Exam: Vitals:   05/10/20 0748 05/10/20 1204  BP: (!) 116/46 114/68  Pulse: 62 65  Resp: 18 (!) 24  Temp: 97.7 F (36.5 C) 98.4 F (36.9 C)  SpO2: 95% (!) 89%   Vitals:   05/09/20 1955 05/10/20 0501 05/10/20 0748 05/10/20 1204  BP: (!) 145/84 132/77 (!) 116/46 114/68  Pulse: 64 64 62 65  Resp: 17 16 18  (!) 24  Temp: (!) 97.5 F (36.4 C) 98 F (36.7 C) 97.7 F (36.5 C) 98.4 F (36.9 C)  TempSrc: Oral  Oral Oral  SpO2: 92% 96% 95% (!) 89%  Weight:      Height:        General: Pt is alert, awake, not in acute distress Cardiovascular: RRR, S1/S2 +, no rubs, no gallops Respiratory: CTA bilaterally, no wheezing, no rhonchi Abdominal: Soft, NT, ND, bowel sounds + Extremities: no edema, no cyanosis   The results of significant diagnostics from this hospitalization (including imaging, microbiology, ancillary and laboratory) are listed below for reference.    Microbiology: Recent Results (from the past 240 hour(s))  Blood Culture (routine x 2)     Status: None   Collection Time: 05/04/20 11:13 PM   Specimen: BLOOD  Result Value Ref Range Status   Specimen Description BLOOD RFOA  Final   Special Requests BOTTLES DRAWN AEROBIC AND ANAEROBIC BCAV  Final   Culture   Final    NO GROWTH 5 DAYS Performed at [facility], [address] [place], [place]    Report Status 05/10/2020 FINAL  Final  Blood  Culture (routine x 2)     Status: None   Collection Time: 05/04/20 11:13 PM   Specimen: BLOOD  Result Value Ref Range Status   Specimen Description BLOOD LAC  Final   Special Requests BOTTLES DRAWN AEROBIC AND ANAEROBIC BCAV  Final   Culture   Final    NO GROWTH 5 DAYS Performed at [facility], [address], [place], [place]    Report Status 05/09/2020 FINAL  Final  Blood Culture (routine x 2)     Status: None (Preliminary result)   Collection Time: 05/06/20  2:19 PM   Specimen: BLOOD  Result Value Ref Range Status   Specimen Description BLOOD LEFT ANTECUBITAL  Final   Special Requests   Final    BOTTLES DRAWN AEROBIC AND ANAEROBIC Blood Culture results may not be optimal due to an inadequate volume of blood received in culture bottles   Culture   Final    NO GROWTH 4 DAYS Performed at [facility], [address] [place], [place] [address]    Report Status PENDING  Incomplete  Blood Culture (routine x 2)     Status: None (Preliminary result)   Collection Time: 05/06/20  2:20 PM   Specimen: BLOOD  Result Value Ref Range Status   Specimen Description BLOOD RIGHT ANTECUBITAL  Final   Special Requests   Final    BOTTLES DRAWN AEROBIC AND ANAEROBIC Blood Culture adequate volume   Culture   Final    NO GROWTH 4 DAYS Performed at [facility], [address] [place], [place] [address]    Report Status PENDING  Incomplete     Labs: BNP (last 3 results) Recent Labs    05/04/20 2313 05/07/20 0606  BNP 30.1 54.8   Basic Metabolic Panel: Recent Labs  Lab 05/04/20 2313 05/05/20 0411 05/05/20 1228 05/06/20 1254 05/08/20 0859 05/09/20 1730 05/10/20 0451  NA   < >  --  133* 134* 140 134* 137  Walter   < >  --  3.8 4.3 3.8 3.9 4.7  CL   < >  --  97* 98 100 96* 101  CO2   < >  --  23 23 26 26 29   GLUCOSE   < >  --  450* 457* 214* 275* 254*  BUN   < >  --  25* 35* 24* 23* 22*  CREATININE   < >  --  1.16 1.16 0.94 0.73 0.78  CALCIUM    < >  --  7.9* 8.0* 8.7* 8.7* 8.5*  MG  --  2.1  --   --   --   --   --    < > = values in this interval not displayed.   Liver Function Tests: Recent Labs  Lab 05/05/20 1228 05/06/20 1254 05/08/20 0859 05/09/20 1730 05/10/20 0451  AST 44* 51* 67* 81* 50*  ALT 29 30 42 63* 54*  ALKPHOS 53 54 65 64 54  BILITOT 1.1 1.0 1.0 0.8 0.7  PROT 7.1 7.2 7.6 7.2 6.5  ALBUMIN 3.0* 3.0* 3.3* 3.3* 3.0*   No results for input(s): LIPASE, AMYLASE in the last 168 hours. No results for input(s): AMMONIA in the last 168 hours. CBC: Recent Labs  Lab 05/06/20 1254 05/07/20 0606 05/08/20 0859 05/09/20 1730 05/10/20 0451  WBC 13.3* 7.2 9.3 12.6* 8.9  NEUTROABS  --  5.7 7.7 7.9* 6.8  HGB 14.6 14.0 14.8 15.9 14.2  HCT 41.7 41.2 45.2 45.9 42.3  MCV 82.2 84.4 84.8 82.3 85.3  PLT 270 270 386 389 311   Cardiac Enzymes: No results for input(s): CKTOTAL, CKMB, CKMBINDEX, TROPONINI in the last 168 hours. BNP: Invalid input(s): POCBNP CBG: Recent Labs  Lab 05/09/20 1955 05/10/20 0205 05/10/20 0555 05/10/20 0748 05/10/20 1205  GLUCAP 294* 286* 261* 232* 263*   D-Dimer No results for input(s): DDIMER in the last 72 hours. Hgb A1c No results for input(s): HGBA1C in the last 72 hours. Lipid Profile No results for input(s): CHOL, HDL, LDLCALC, TRIG, CHOLHDL, LDLDIRECT in the last 72 hours. Thyroid function studies No results for input(s): TSH, T4TOTAL,  T3FREE, THYROIDAB in the last 72 hours.  Invalid input(s): FREET3 Anemia work up Recent Labs    05/09/20 1730 05/10/20 0451  FERRITIN 749* 557*   Urinalysis No results found for: COLORURINE, APPEARANCEUR, LABSPEC, PHURINE, GLUCOSEU, HGBUR, BILIRUBINUR, KETONESUR, PROTEINUR, UROBILINOGEN, NITRITE, LEUKOCYTESUR Sepsis Labs Invalid input(s): PROCALCITONIN,  WBC,  LACTICIDVEN Microbiology Recent Results (from the past 240 hour(s))  Blood Culture (routine x 2)     Status: None   Collection Time: 05/04/20 11:13 PM   Specimen: BLOOD   Result Value Ref Range Status   Specimen Description BLOOD RFOA  Final   Special Requests BOTTLES DRAWN AEROBIC AND ANAEROBIC BCAV  Final   Culture   Final    NO GROWTH 5 DAYS Performed at [facility], [address], [place], [place] [address]    Report Status 05/10/2020 FINAL  Final  Blood Culture (routine x 2)     Status: None   Collection Time: 05/04/20 11:13 PM   Specimen: BLOOD  Result Value Ref Range Status   Specimen Description BLOOD LAC  Final   Special Requests BOTTLES DRAWN AEROBIC AND ANAEROBIC BCAV  Final   Culture   Final    NO GROWTH 5 DAYS Performed at [facility], [address] [place], [place] [address]    Report Status 05/09/2020 FINAL  Final  Blood Culture (routine x 2)     Status: None (Preliminary result)   Collection Time: 05/06/20  2:19 PM   Specimen: BLOOD  Result Value Ref Range Status   Specimen Description BLOOD LEFT ANTECUBITAL  Final   Special Requests   Final    BOTTLES DRAWN AEROBIC AND ANAEROBIC Blood Culture results may not be optimal due to an inadequate volume of blood received in culture bottles   Culture   Final    NO GROWTH 4 DAYS Performed at [facility], [address] [place], [place] [address]    Report Status PENDING  Incomplete  Blood Culture (routine x 2)     Status: None (Preliminary result)   Collection Time: 05/06/20  2:20 PM   Specimen: BLOOD  Result Value Ref Range Status   Specimen Description BLOOD RIGHT ANTECUBITAL  Final   Special Requests   Final    BOTTLES DRAWN AEROBIC AND ANAEROBIC Blood Culture adequate volume   Culture   Final    NO GROWTH 4 DAYS Performed at [facility], [address] [place], [place] [address]    Report Status PENDING  Incomplete    Time coordinating discharge: Over 30 minutes  SIGNED:  Arnetha Courser, MD  [facility] 05/10/2020, 2:02 PM  If 7PM-7AM, please contact night-coverage [email or web address]  This record has been created  using Conservation officer, historic buildings. Errors have been sought and corrected,but may not always be located. Such creation errors do not reflect on the standard of care.

## 2020-05-10 NOTE — TOC Initial Note (Signed)
Transition of Care [facility]) - Initial/Assessment Note    Patient Details  Name: Walter [name] MRN: [id number] Date of Birth: 19-Dec-1965  Transition of Care [facility]) CM/SW Contact:    Allayne Butcher, RN Phone Number: 05/10/2020, 12:15 PM  Clinical Narrative:                 Patient admitted to the hospital for COVID 19 requiring oxygen at 6L [place].  Patient wants to go home today and would like to go with oxygen.  Patient does not have insurance so [employer] will need to reach out to the patient and put a credit card on file before they can deliver.  Once [employer] has reached the patient and discuss financials they will deliver 4 O2 tanks to the room and then a concentrator will be delivered to the patient's home.  Address in Epic verified as correct.  Patient has no other needs at this time.    Expected Discharge Plan: Home/Self Care Barriers to Discharge: No Barriers Identified   Patient Goals and CMS Choice Patient states their goals for this hospitalization and ongoing recovery are:: Patient wants to leave and go home today CMS Medicare.gov Compare Post Acute Care list provided to:: Patient Choice offered to / list presented to : Patient  Expected Discharge Plan and Services Expected Discharge Plan: Home/Self Care   Discharge Planning Services: CM Consult Post Acute Care Choice: Durable Medical Equipment Living arrangements for the past 2 months: Single Family Home                 DME Arranged: Oxygen DME Agency: [employer] Date DME Agency Contacted: 05/10/20 Time DME Agency Contacted: 1214 Representative spoke with at DME Agency: Oletha Cruel HH Arranged: NA          Prior Living Arrangements/Services Living arrangements for the past 2 months: Single Family Home Lives with:: Adult Children Patient language and need for interpreter reviewed:: Yes Do you feel safe going back to the place where you live?: Yes      Need for Family Participation in Patient Care: Yes (Comment)  (COVID) Care giver support system in place?: Yes (comment) (son and daughter)   Criminal Activity/Legal Involvement Pertinent to Current Situation/Hospitalization: No - Comment as needed  Activities of Daily Living Home Assistive Devices/Equipment: None ADL Screening (condition at time of admission) Patient's cognitive ability adequate to safely complete daily activities?: Yes Is the patient deaf or have difficulty hearing?: No Does the patient have difficulty seeing, even when wearing glasses/contacts?: No Does the patient have difficulty concentrating, remembering, or making decisions?: No Patient able to express need for assistance with ADLs?: No Does the patient have difficulty dressing or bathing?: No Independently performs ADLs?: Yes (appropriate for developmental age) Does the patient have difficulty walking or climbing stairs?: No Weakness of Legs: None Weakness of Arms/Hands: None  Permission Sought/Granted Permission sought to share information with : Case Manager, Other (comment) Permission granted to share information with : Yes, Verbal Permission Granted     Permission granted to share info w AGENCY: [employer]        Emotional Assessment   Attitude/Demeanor/Rapport: Engaged Affect (typically observed): Accepting Orientation: : Oriented to Self, Oriented to Place, Oriented to  Time, Oriented to Situation Alcohol / Substance Use: Not Applicable Psych Involvement: No (comment)  Admission diagnosis:  Acute respiratory failure with hypoxia (HCC) [J96.01] COVID-19 virus infection [U07.1] Pneumonia due to COVID-19 virus [U07.1, J12.82] Patient Active Problem List   Diagnosis Date Noted  .  Acute respiratory failure with hypoxia (HCC) 05/06/2020  . Diabetes mellitus without complication (HCC)   . Hypertension   . Depression   . Pneumonia due to COVID-19 virus 05/05/2020   PCP:  Gaspar Garbe, MD Pharmacy:   [facility] [address], [place] [address] [place] [address] Phone: [phone] Fax: [phone]     Social Determinants of Health (SDOH) Interventions    Readmission Risk Interventions No flowsheet data found.

## 2020-05-10 NOTE — TOC Transition Note (Signed)
Transition of Care [facility]) - CM/SW Discharge Note   Patient Details  Name: Walter [name] MRN: [id number] Date of Birth: [birth year]  Transition of Care [facility]) CM/SW Contact:  Allayne Butcher, RN Phone Number: 05/10/2020, 2:35 PM   Clinical Narrative:    Patient's medications e scripted to Medication Management and patient given an application form to fill out.  Patient's daughter Walter [name] reports that she will pick up his prescriptions this afternoon when she gets off of work.  Oxygen tanks have been delivered to the room and patient is ready to DC.    Final next level of care: Home/Self Care Barriers to Discharge: No Barriers Identified   Patient Goals and CMS Choice Patient states their goals for this hospitalization and ongoing recovery are:: Patient wants to leave and go home today CMS Medicare.gov Compare Post Acute Care list provided to:: Patient Choice offered to / list presented to : Patient  Discharge Placement                       Discharge Plan and Services   Discharge Planning Services: CM Consult Post Acute Care Choice: Durable Medical Equipment          DME Arranged: Oxygen DME Agency: [employer] Date DME Agency Contacted: 05/10/20 Time DME Agency Contacted: 1214 Representative spoke with at DME Agency: Oletha Cruel HH Arranged: NA          Social Determinants of Health (SDOH) Interventions     Readmission Risk Interventions No flowsheet data found.

## 2020-05-10 NOTE — Progress Notes (Signed)
SATURATION QUALIFICATIONS: (This note is used to comply with regulatory documentation for home oxygen)  Patient Saturations on Room Air at Rest = 77%  Patient Saturations on Room Air while Ambulating = 74%  Patient Saturations on 6 Liters of oxygen while Ambulating = 84%  Please briefly explain why patient needs home oxygen:

## 2020-05-10 NOTE — Progress Notes (Signed)
Reviewed AVS with pt all questions answered  

## 2020-05-11 LAB — CULTURE, BLOOD (ROUTINE X 2)
Culture: NO GROWTH
Culture: NO GROWTH
Special Requests: ADEQUATE

## 2020-08-17 ENCOUNTER — Telehealth: Payer: Self-pay | Admitting: Pharmacist

## 2020-08-17 NOTE — Telephone Encounter (Signed)
Patient failed to provide requested 2021 financial documentation. No additional medication assistance will be provided by [facility] without the required proof of income documentation. Patient notified by letter Debra Cheek Administrative Assistant [facility] 

## 2021-06-06 IMAGING — DX DG CHEST 1V PORT
1 series · 1 of 1 positions shown · non-contrast
Comparison: [REDACTED] 0106

CLINICAL DATA: Shortness of breath

EXAM:
PORTABLE CHEST 1 VIEW

[chest ap]
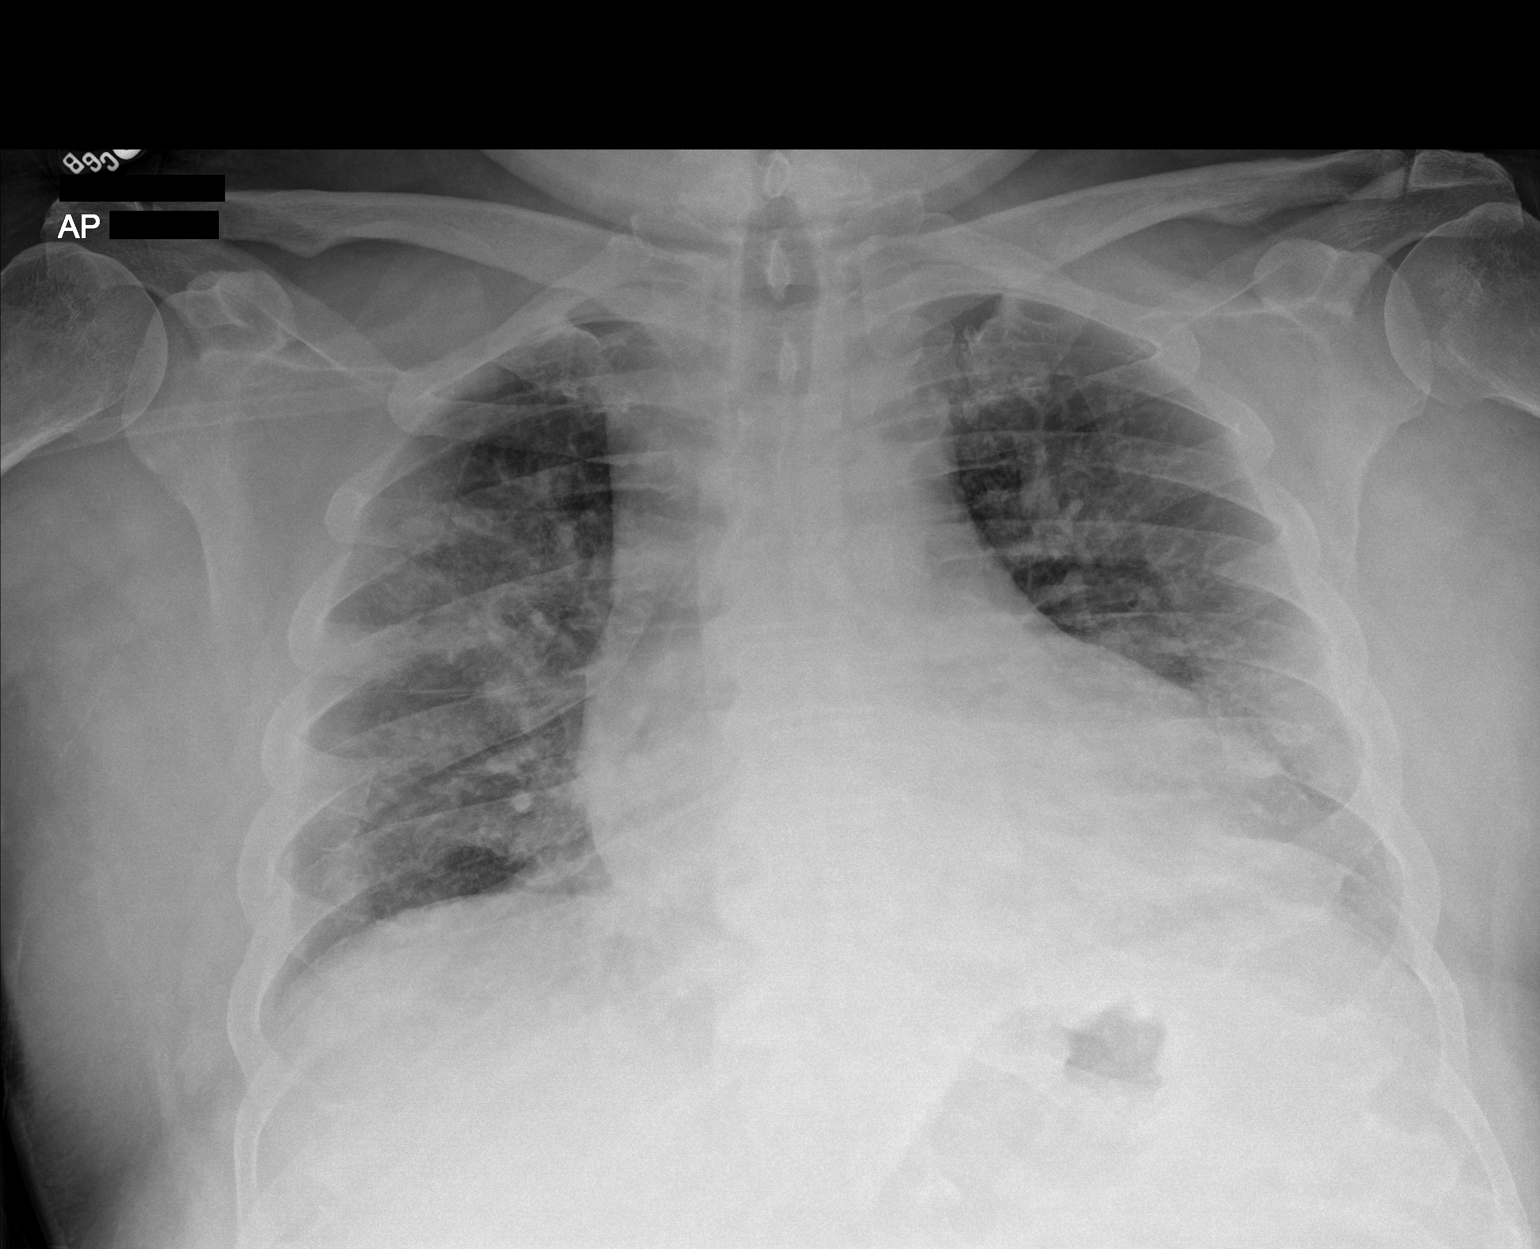

[1 of 1 positions shown; findings below may reference images not displayed]

FINDINGS: The cardiomediastinal silhouette is unchanged in contour. No pleural
effusion. No pneumothorax. Similar appearance of patchy bilateral
heterogeneous opacities peripherally. Peribronchial cuffing and
perihilar vascular congestion. Visualized abdomen is unremarkable.
No acute osseous abnormality.
IMPRESSION: 1. Similar appearance of patchy bilateral heterogeneous opacities
peripherally which may reflect the sequela of COVID 19 pneumonia.

2. Peribronchial cuffing and perihilar vascular congestion may
reflect an underlying superimposed component of pulmonary edema.

## 2023-03-11 DIAGNOSIS — I10 Essential (primary) hypertension: Secondary | ICD-10-CM | POA: Diagnosis not present

## 2023-03-11 DIAGNOSIS — E7800 Pure hypercholesterolemia, unspecified: Secondary | ICD-10-CM | POA: Diagnosis not present

## 2023-03-11 DIAGNOSIS — E1129 Type 2 diabetes mellitus with other diabetic kidney complication: Secondary | ICD-10-CM | POA: Diagnosis not present

## 2023-03-11 DIAGNOSIS — E1165 Type 2 diabetes mellitus with hyperglycemia: Secondary | ICD-10-CM | POA: Diagnosis not present

## 2023-03-11 DIAGNOSIS — G4700 Insomnia, unspecified: Secondary | ICD-10-CM | POA: Diagnosis not present

## 2023-03-11 DIAGNOSIS — Z794 Long term (current) use of insulin: Secondary | ICD-10-CM | POA: Diagnosis not present

## 2023-04-15 DIAGNOSIS — F320 Major depressive disorder, single episode, mild: Secondary | ICD-10-CM | POA: Diagnosis not present

## 2023-04-15 DIAGNOSIS — E119 Type 2 diabetes mellitus without complications: Secondary | ICD-10-CM | POA: Diagnosis not present

## 2023-04-15 DIAGNOSIS — Z823 Family history of stroke: Secondary | ICD-10-CM | POA: Diagnosis not present

## 2023-04-15 DIAGNOSIS — I951 Orthostatic hypotension: Secondary | ICD-10-CM | POA: Diagnosis not present

## 2023-04-15 DIAGNOSIS — Z803 Family history of malignant neoplasm of breast: Secondary | ICD-10-CM | POA: Diagnosis not present

## 2023-04-15 DIAGNOSIS — Z6838 Body mass index (BMI) 38.0-38.9, adult: Secondary | ICD-10-CM | POA: Diagnosis not present

## 2023-04-15 DIAGNOSIS — Z833 Family history of diabetes mellitus: Secondary | ICD-10-CM | POA: Diagnosis not present

## 2023-04-15 DIAGNOSIS — Z8249 Family history of ischemic heart disease and other diseases of the circulatory system: Secondary | ICD-10-CM | POA: Diagnosis not present

## 2023-04-15 DIAGNOSIS — Z794 Long term (current) use of insulin: Secondary | ICD-10-CM | POA: Diagnosis not present

## 2023-04-15 DIAGNOSIS — I10 Essential (primary) hypertension: Secondary | ICD-10-CM | POA: Diagnosis not present

## 2023-04-15 DIAGNOSIS — Z5986 Financial insecurity: Secondary | ICD-10-CM | POA: Diagnosis not present

## 2023-06-24 DIAGNOSIS — E1129 Type 2 diabetes mellitus with other diabetic kidney complication: Secondary | ICD-10-CM | POA: Diagnosis not present

## 2023-06-24 DIAGNOSIS — Z0189 Encounter for other specified special examinations: Secondary | ICD-10-CM | POA: Diagnosis not present

## 2023-06-24 DIAGNOSIS — E785 Hyperlipidemia, unspecified: Secondary | ICD-10-CM | POA: Diagnosis not present

## 2023-06-24 DIAGNOSIS — Z1389 Encounter for screening for other disorder: Secondary | ICD-10-CM | POA: Diagnosis not present

## 2023-06-24 DIAGNOSIS — Z0000 Encounter for general adult medical examination without abnormal findings: Secondary | ICD-10-CM | POA: Diagnosis not present

## 2023-07-01 DIAGNOSIS — Z1331 Encounter for screening for depression: Secondary | ICD-10-CM | POA: Diagnosis not present

## 2023-07-01 DIAGNOSIS — E7800 Pure hypercholesterolemia, unspecified: Secondary | ICD-10-CM | POA: Diagnosis not present

## 2023-07-01 DIAGNOSIS — I10 Essential (primary) hypertension: Secondary | ICD-10-CM | POA: Diagnosis not present

## 2023-07-01 DIAGNOSIS — R82998 Other abnormal findings in urine: Secondary | ICD-10-CM | POA: Diagnosis not present

## 2023-07-01 DIAGNOSIS — G4700 Insomnia, unspecified: Secondary | ICD-10-CM | POA: Diagnosis not present

## 2023-07-01 DIAGNOSIS — E1165 Type 2 diabetes mellitus with hyperglycemia: Secondary | ICD-10-CM | POA: Diagnosis not present

## 2023-07-01 DIAGNOSIS — Z794 Long term (current) use of insulin: Secondary | ICD-10-CM | POA: Diagnosis not present

## 2023-07-01 DIAGNOSIS — Z1339 Encounter for screening examination for other mental health and behavioral disorders: Secondary | ICD-10-CM | POA: Diagnosis not present

## 2023-07-01 DIAGNOSIS — Z0000 Encounter for general adult medical examination without abnormal findings: Secondary | ICD-10-CM | POA: Diagnosis not present

## 2023-07-01 DIAGNOSIS — Z91148 Patient's other noncompliance with medication regimen for other reason: Secondary | ICD-10-CM | POA: Diagnosis not present

## 2023-07-01 DIAGNOSIS — E1129 Type 2 diabetes mellitus with other diabetic kidney complication: Secondary | ICD-10-CM | POA: Diagnosis not present

## 2023-10-24 DIAGNOSIS — Z794 Long term (current) use of insulin: Secondary | ICD-10-CM | POA: Diagnosis not present

## 2023-10-24 DIAGNOSIS — E1122 Type 2 diabetes mellitus with diabetic chronic kidney disease: Secondary | ICD-10-CM | POA: Diagnosis not present

## 2023-10-24 DIAGNOSIS — I129 Hypertensive chronic kidney disease with stage 1 through stage 4 chronic kidney disease, or unspecified chronic kidney disease: Secondary | ICD-10-CM | POA: Diagnosis not present

## 2023-10-24 DIAGNOSIS — Z809 Family history of malignant neoplasm, unspecified: Secondary | ICD-10-CM | POA: Diagnosis not present

## 2023-10-24 DIAGNOSIS — E785 Hyperlipidemia, unspecified: Secondary | ICD-10-CM | POA: Diagnosis not present

## 2023-10-24 DIAGNOSIS — Z8249 Family history of ischemic heart disease and other diseases of the circulatory system: Secondary | ICD-10-CM | POA: Diagnosis not present

## 2023-10-24 DIAGNOSIS — Z833 Family history of diabetes mellitus: Secondary | ICD-10-CM | POA: Diagnosis not present

## 2023-10-24 DIAGNOSIS — Z823 Family history of stroke: Secondary | ICD-10-CM | POA: Diagnosis not present

## 2023-10-24 DIAGNOSIS — N181 Chronic kidney disease, stage 1: Secondary | ICD-10-CM | POA: Diagnosis not present

## 2023-10-24 DIAGNOSIS — F325 Major depressive disorder, single episode, in full remission: Secondary | ICD-10-CM | POA: Diagnosis not present

## 2023-10-24 DIAGNOSIS — Z6839 Body mass index (BMI) 39.0-39.9, adult: Secondary | ICD-10-CM | POA: Diagnosis not present

## 2024-04-21 ENCOUNTER — Emergency Department

## 2024-04-21 ENCOUNTER — Emergency Department
Admission: EM | Admit: 2024-04-21 | Discharge: 2024-04-21 | Disposition: A | Discharge status: Home / Self Care | Attending: Emergency Medicine | Admitting: Emergency Medicine

## 2024-04-21 DIAGNOSIS — S39012A Strain of muscle, fascia and tendon of lower back, initial encounter: Secondary | ICD-10-CM | POA: Insufficient documentation

## 2024-04-21 DIAGNOSIS — T148XXA Other injury of unspecified body region, initial encounter: Secondary | ICD-10-CM

## 2024-04-21 DIAGNOSIS — S3992XA Unspecified injury of lower back, initial encounter: Secondary | ICD-10-CM | POA: Diagnosis present

## 2024-04-21 DIAGNOSIS — W11XXXA Fall on and from ladder, initial encounter: Secondary | ICD-10-CM | POA: Diagnosis not present

## 2024-04-21 DIAGNOSIS — E119 Type 2 diabetes mellitus without complications: Secondary | ICD-10-CM | POA: Diagnosis not present

## 2024-04-21 DIAGNOSIS — I10 Essential (primary) hypertension: Secondary | ICD-10-CM | POA: Diagnosis not present

## 2024-04-21 MED ORDER — METHOCARBAMOL 500 MG PO TABS: 500.0000 mg | ORAL_TABLET | Freq: Three times a day (TID) | ORAL | 0 refills | Status: AC | PRN

## 2024-04-21 MED ORDER — MELOXICAM 7.5 MG PO TABS: 7.5000 mg | ORAL_TABLET | Freq: Every day | ORAL | 0 refills | Status: AC

## 2024-04-21 NOTE — ED Triage Notes (Addendum)
 Pt c/o low back pain and bilateral hip pain r/t falling off a ladder this afternoon.  Pt reports he was about 69ft off the ground.  Pt reports landing on a wooden floor.  Denies LOC and tingling/numbness.       Pt was able to walk into triage w/o assistance.

## 2024-04-21 NOTE — Discharge Instructions (Addendum)
 We believe that your symptoms are caused by musculoskeletal strain.  Please read through the included information about additional care such as heating pads, over-the-counter pain medicine. Remember that early mobility and using the affected part of your body is actually better than keeping it immobile.  You were prescribed Meloxicam  (antiinflammatory) and Methocarbamol  (muscle relaxer) to help with your pain.   Please take these medications only as prescribed. Please do not work, make legal-binding decisions, or operate a motor vehicle while taking the Methocarbamol .   Please do not take Ibuprofen, Aleve, Advil, Motrin, Naproxen, Aspirin , or any other non-steroidal antiinflammatory drug (NSAID) while taking the Meloxicam . Please stop taking the Meloxicam  if you experience any stomach cramping.  Follow-up with your PCP regarding today's visit or in the emergency department for any new, worsening, or concerning symptoms.

## 2024-04-21 NOTE — ED Notes (Signed)
Report given to [facility]

## 2024-04-21 NOTE — ED Provider Notes (Signed)
 [facility] Provider Note    Event Date/Time   First MD Initiated Contact with Patient 04/21/24 1759     (approximate)   History   Fall and Back Pain   HPI Walter [name] is a 58 y.o. male  with a past medical history of diabetes, hypertension, anxiety presents to the emergency department with low back, sacral, and left lower back pain after a fall today.  Patient states he was on a ladder this afternoon trying to put a light back together and fell backwards onto his lower back and left side, roughly 2 feet off the ground.  Had trouble catching his breath initially after the fall, but is feeling better now.  This occurred around 2 PM today.  He has used Tylenol , ibuprofen, and IcyHot which has provided some relief.  Denies chest pain, shortness of breath, abdominal pain, hitting his head, headache, fever, saddle anesthesia, IV drug use, history of cancer, bowel or bladder incontinence or retention.    Physical Exam   Triage Vital Signs: ED Triage Vitals  Encounter Vitals Group     BP 04/21/24 1746 (!) 158/84     Girls Systolic BP Percentile --      Girls Diastolic BP Percentile --      Boys Systolic BP Percentile --      Boys Diastolic BP Percentile --      Pulse Rate 04/21/24 1746 85     Resp 04/21/24 1746 20     Temp 04/21/24 1746 97.8 F (36.6 C)     Temp Source 04/21/24 1746 Oral     SpO2 04/21/24 1746 97 %     Weight 04/21/24 1747 258 lb (117 kg)     Height 04/21/24 1747 5' 7 (1.702 m)     Head Circumference --      Peak Flow --      Pain Score 04/21/24 1746 9     Pain Loc --      Pain Education --      Exclude from Growth Chart --     Most recent vital signs: Vitals:   04/21/24 1746  BP: (!) 158/84  Pulse: 85  Resp: 20  Temp: 97.8 F (36.6 C)  SpO2: 97%    General: Awake, in no acute distress. Appears stated age. Head: Normocephalic, atraumatic. Neck: Supple, no lymphadenopathy, no JVD, no nuchal rigidity. CV: Regular rate, 85  bpm. Peripheral pulses 2+ and symmetric. No edema. Respiratory: Breath sounds clear b/l. No wheezes, rales, or rhonchi. No respiratory distress. Normal respiratory effort. GI: Soft, non-distended, non-tender. No rebound or guarding.  MSK: Ambulatory with normal gait pattern. TTP in midline lumbar region extending into the sacrum and left lower back. Increased pain with lumbar flexion, extension. Full ROM of b/l hips w/o any pain. Skin:Warm, dry, intact. No rashes, lesions, or ecchymosis. No cyanosis or pallor. Neurological: A&Ox4 to person, place, time, and situation. No focal deficits.   ED Results / Procedures / Treatments   Labs (all labs ordered are listed, but only abnormal results are displayed) Labs Reviewed - No data to display   EKG     RADIOLOGY X rays of lumbar spine, sacrum and coccyx, and left ribs ordered.   PROCEDURES:  Critical Care performed: No   Procedures   MEDICATIONS ORDERED IN ED: Medications - No data to display   IMPRESSION / MDM / ASSESSMENT AND PLAN / ED COURSE  I reviewed the triage vital signs and the nursing notes.  Differential diagnosis includes, but is not limited to, mechanical fall, skill skeletal strain, rib fracture, lumbar compression fracture, sacral fracture  Patient's presentation is most consistent with acute complicated illness / injury requiring diagnostic workup.  Patient is a 58 year old male who presented today following a mechanical fall that occurred off a ladder.  Patient reported lumbar, sacral pain and left lower back pain.  X-rays of the lumbar region, sacrum, and left ribs ordered.  I personally viewed and interpreted the x-rays as well as reviewed the radiologist reports.  I agree that there are no acute findings in care including no fracture, or pneumothorax.  Provided patient with meloxicam  and methocarbamol  prescriptions, discussed precautions regarding taking these medications.  He can  also continue with his IcyHot use at home. He can follow-up with his primary care provider as needed, or in the emergency department for any new, concerning, or worsening symptoms.  Patient was given the opportunity to ask questions; all questions were answered. Emergency department return precautions were discussed with the patient.  Patient is in agreement to the treatment plan.  Patient is stable for discharge.   FINAL CLINICAL IMPRESSION(S) / ED DIAGNOSES   Final diagnoses:  Fall from ladder, initial encounter  Musculoskeletal strain     Rx / DC Orders   ED Discharge Orders          Ordered    methocarbamol  (ROBAXIN ) 500 MG tablet  Every 8 hours PRN        04/21/24 1908    meloxicam  (MOBIC ) 7.5 MG tablet  Daily        04/21/24 1908             Note:  This document was prepared using Dragon voice recognition software and may include unintentional dictation errors.     Sheron Salm, PA-C 04/21/24 2016    Dorothyann Drivers, MD 04/21/24 2129
